# Patient Record
Sex: Female | Born: 1955 | Race: White | Hispanic: No | Marital: Married | Smoking: Former smoker
Health system: Southern US, Community
[De-identification: ages and names within clinical notes are randomized; demographics above are authoritative.]

## PROBLEM LIST (undated history)

## (undated) DIAGNOSIS — I8393 Asymptomatic varicose veins of bilateral lower extremities: Secondary | ICD-10-CM

## (undated) HISTORY — DX: Asymptomatic varicose veins of bilateral lower extremities: I83.93

## (undated) HISTORY — PX: OTHER SURGICAL HISTORY: SHX169

---

## 1999-04-10 ENCOUNTER — Ambulatory Visit (HOSPITAL_BASED_OUTPATIENT_CLINIC_OR_DEPARTMENT_OTHER): Admission: RE | Admit: 1999-04-10 | Discharge: 1999-04-10 | Payer: Self-pay

## 1999-08-16 ENCOUNTER — Other Ambulatory Visit: Admission: RE | Admit: 1999-08-16 | Discharge: 1999-08-16 | Payer: Self-pay | Admitting: Obstetrics and Gynecology

## 2001-01-23 ENCOUNTER — Other Ambulatory Visit: Admission: RE | Admit: 2001-01-23 | Discharge: 2001-01-23 | Payer: Self-pay | Admitting: Obstetrics and Gynecology

## 2002-02-17 ENCOUNTER — Other Ambulatory Visit: Admission: RE | Admit: 2002-02-17 | Discharge: 2002-02-17 | Payer: Self-pay | Admitting: Obstetrics and Gynecology

## 2003-09-21 ENCOUNTER — Other Ambulatory Visit: Admission: RE | Admit: 2003-09-21 | Discharge: 2003-09-21 | Payer: Self-pay | Admitting: Obstetrics and Gynecology

## 2005-01-30 ENCOUNTER — Other Ambulatory Visit: Admission: RE | Admit: 2005-01-30 | Discharge: 2005-01-30 | Payer: Self-pay | Admitting: Obstetrics and Gynecology

## 2008-01-16 ENCOUNTER — Emergency Department (HOSPITAL_COMMUNITY): Admission: EM | Admit: 2008-01-16 | Discharge: 2008-01-16 | Payer: Self-pay | Admitting: Emergency Medicine

## 2010-10-31 ENCOUNTER — Ambulatory Visit: Payer: Self-pay | Admitting: Vascular Surgery

## 2011-02-05 ENCOUNTER — Ambulatory Visit: Payer: Self-pay | Admitting: Vascular Surgery

## 2011-04-16 NOTE — Procedures (Signed)
LOWER EXTREMITY VENOUS REFLUX EXAM   INDICATION:  Varicose veins with pain.   EXAM:  Using color-flow imaging and pulse Doppler spectral analysis, the  right and left common femoral, superficial femoral, popliteal, posterior  tibial, greater and lesser saphenous veins are evaluated.  There is no  evidence suggesting deep venous insufficiency in the right and left  lower extremities.   The right saphenofemoral junction is competent. The left saphenofemoral  junction is not competent with reflux of >500 milliseconds.  The right  and left GSV's are not competent with Reflux of >558milliseconds with  the caliber as described below.   The right and left proximal short saphenous vein was not visualized.   GSV Diameter (used if found to be incompetent only)                                            Right    Left  Proximal Greater Saphenous Vein           0.56 cm  0.71 cm  Proximal-to-mid-thigh                     0.40 cm  0.54 cm  Mid thigh                                 0.45 cm  0.59 cm  Mid-distal thigh                          cm       cm  Distal thigh                              0.43 cm  0.56 cm  Knee                                      cm       cm    IMPRESSION:  1. Right and left greater saphenous vein is not competent with reflux      of >58milliseconds.  2. The right and left greater saphenous veins are tortuous.  3. The deep venous system is competent.  4. The right and left short saphenous veins were not visualized.          ___________________________________________  Larina Earthly, M.D.   OD/MEDQ  D:  10/31/2010  T:  10/31/2010  Job:  [id number]

## 2011-04-16 NOTE — Consult Note (Signed)
NEW PATIENT CONSULTATION   Joanna Floyd, Joanna Floyd  DOB:  04-02-1956                                       10/31/2010  CHART#:[id number]   The patient presents today for evaluation of lower extremity  difficulties.  Her main complaint is severe muscle cramps bilaterally.  This happens mostly at night and can awaken her from sleep, causes  severe discomfort, and she has to walk to relieve the discomfort.  She  has had discomfort despite all treatment possibilities.  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 Marcy Panning approximately  10 or 11 years ago.  She was not pleased with her results and did not  seek any treatment for recurrent varicosities over time.  She is hopeful  that the these veins may be causing the severe cramping that she is  having and is seeking further evaluation.  She does not have any history  of DVT.  She does have no major medical difficulties to include any  cardiac or pulmonary dysfunction.   SOCIAL HISTORY:  She is married with 2 children.  She is a IT trainer.  She quit smoking at in 1981.  Has rare social  alcohol consumption.   FAMILY HISTORY:  Significant for varicosities in her mother.   REVIEW OF SYSTEMS:  No weight loss or gain.  She weighs 190 pounds.  She  is 5 feet 2 inches tall.  She does have pain in her legs as described  with cramping.  She does have history of anxiety.  Her review of systems  is otherwise negative except for HPI.   PHYSICAL EXAM:  General:  Well developed, well nourished white female  appearing stated age in no acute stress.  Blood pressure is 168/92,  pulse 88, respirations 22.  HEENT is normal.  Abdomen:  Soft, nontender.  No masses noted.  Musculoskeletal shows no major deformity or cyanosis.  Neurologic:  No focal weakness or paresthesias.  Skin without ulcers or  rashes.  She does have 2+ dorsalis  pedis pulses bilaterally.  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   She underwent a formal venous duplex in our office and this revealed no  evidence of DVT.  She does have some mild reflux in her deep system.  She does not have visualization of her small saphenous vein bilaterally.  She has what appears to be tributary varicosities around her great  saphenous vein.  On imaging to her thigh, there is a great deal of  tortuosity of branches but I do not see any main trunk of her saphenous  vein and assume that she has had prior stripping of this.   I have discussed options with the patient.  I have explained that I do  not feel that her muscle spasms are related to venous pathology and that  if we corrected what we could that she would still have discomfort.  She  does have pain specifically over the tributary varicosities with  prolonged standing and I explained that this could be treated with both  sclerotherapy for her reticular veins and stab phlebectomy for the  tributary varicosities to improve symptoms specifically of the veins  themselves.  She has not worn compression garments or tried conservative  therapy.  We have fitted her with thigh-high compression garments today  and instructed her on the use of these.  She will continue the use of  this and we will see her again in 3 months to most determine if this is  successfully treating her pain.     Larina Earthly, M.D.  Electronically Signed   TFE/MEDQ  D:  10/31/2010  T:  11/01/2010  Job:  [id number]   cc:   Miguel Aschoff, M.D.  Daniel B. Yetta Barre, M.D.

## 2011-08-23 LAB — BASIC METABOLIC PANEL
CO2: 25
Calcium: 8.6
Chloride: 107
GFR calc Af Amer: >60
Potassium: 4.6
Sodium: 140

## 2011-08-23 LAB — URINALYSIS, ROUTINE W REFLEX MICROSCOPIC
Bilirubin Urine: NEGATIVE
Ketones, ur: NEGATIVE
Protein, ur: NEGATIVE
Urobilinogen, UA: 0.2

## 2011-08-23 LAB — CBC
Hemoglobin: 12.1
MCHC: 33.9
MCV: 78.8
RBC: 4.53

## 2011-08-23 LAB — DIFFERENTIAL
Basophils Relative: 0
Monocytes Absolute: 0.3
Monocytes Relative: 5
Neutro Abs: 5.6

## 2012-05-07 ENCOUNTER — Ambulatory Visit (INDEPENDENT_AMBULATORY_CARE_PROVIDER_SITE_OTHER): Payer: BC Managed Care – PPO | Admitting: Family Medicine

## 2012-05-07 ENCOUNTER — Ambulatory Visit: Payer: BC Managed Care – PPO

## 2012-05-07 VITALS — BP 160/100 | HR 63 | Temp 98.4°F | Resp 16 | Ht 61.58 in | Wt 199.0 lb

## 2012-05-07 DIAGNOSIS — R030 Elevated blood-pressure reading, without diagnosis of hypertension: Secondary | ICD-10-CM

## 2012-05-07 DIAGNOSIS — M25539 Pain in unspecified wrist: Secondary | ICD-10-CM

## 2012-05-07 DIAGNOSIS — M25532 Pain in left wrist: Secondary | ICD-10-CM

## 2012-05-07 DIAGNOSIS — M79642 Pain in left hand: Secondary | ICD-10-CM

## 2012-05-07 MED ORDER — ACETAMINOPHEN-CODEINE #3 300-30 MG PO TABS: ORAL_TABLET | ORAL | Status: DC

## 2012-05-07 MED ORDER — IBUPROFEN 800 MG PO TABS: 800.0000 mg | ORAL_TABLET | Freq: Three times a day (TID) | ORAL | Status: AC | PRN

## 2012-05-07 NOTE — Progress Notes (Addendum)
  Subjective:    Patient ID: Joanna Floyd, female    DOB: 04/25/1956, 56 y.o.   MRN: [id number]  HPI  Patient presents after slipping on deck on outstretched (L) hand Now with swelling, pain and reduced ROM of (L) wrist/hand  PMH/IBS         Anemia         Situational anxiety          H/O HTN  Review of Systems     Objective:   Physical Exam  Constitutional: She appears well-developed.  Cardiovascular: Normal rate, regular rhythm and normal heart sounds.   Pulmonary/Chest: Effort normal and breath sounds normal.  Musculoskeletal:       Left wrist: She exhibits decreased range of motion, bony tenderness, swelling and deformity.     [facility] reading (PRIMARY) by  Dr. Hal Hope  Impacted fracture of the distal radius of the (L) arm        Assessment & Plan:   1. impacted fracture of the left distal radius   DG Hand Complete Left, Ambulatory referral to Family Practice, acetaminophen-codeine (TYLENOL #3) 300-30 MG per tablet, ibuprofen (ADVIL,MOTRIN) 800 MG tablet  2. Wrist pain, left  DG Wrist Complete Left  3. Elevated BP  Patient to follow up with Dr. Tenny Craw regarding BP controll     See PA note Fracture splinted Patient to follow up with Dr. Althea Charon next week

## 2012-07-29 ENCOUNTER — Encounter: Payer: Self-pay | Admitting: Physician Assistant

## 2012-07-29 DIAGNOSIS — S52509A Unspecified fracture of the lower end of unspecified radius, initial encounter for closed fracture: Secondary | ICD-10-CM

## 2012-10-13 IMAGING — CR DG HAND COMPLETE 3+V*L*
3 series · 3 of 3 positions shown · non-contrast
Comparison: Left wrist series from same day.

CLINICAL DATA: 56-year-old female with pain.

LEFT HAND - COMPLETE 3+ VIEW

[PA]
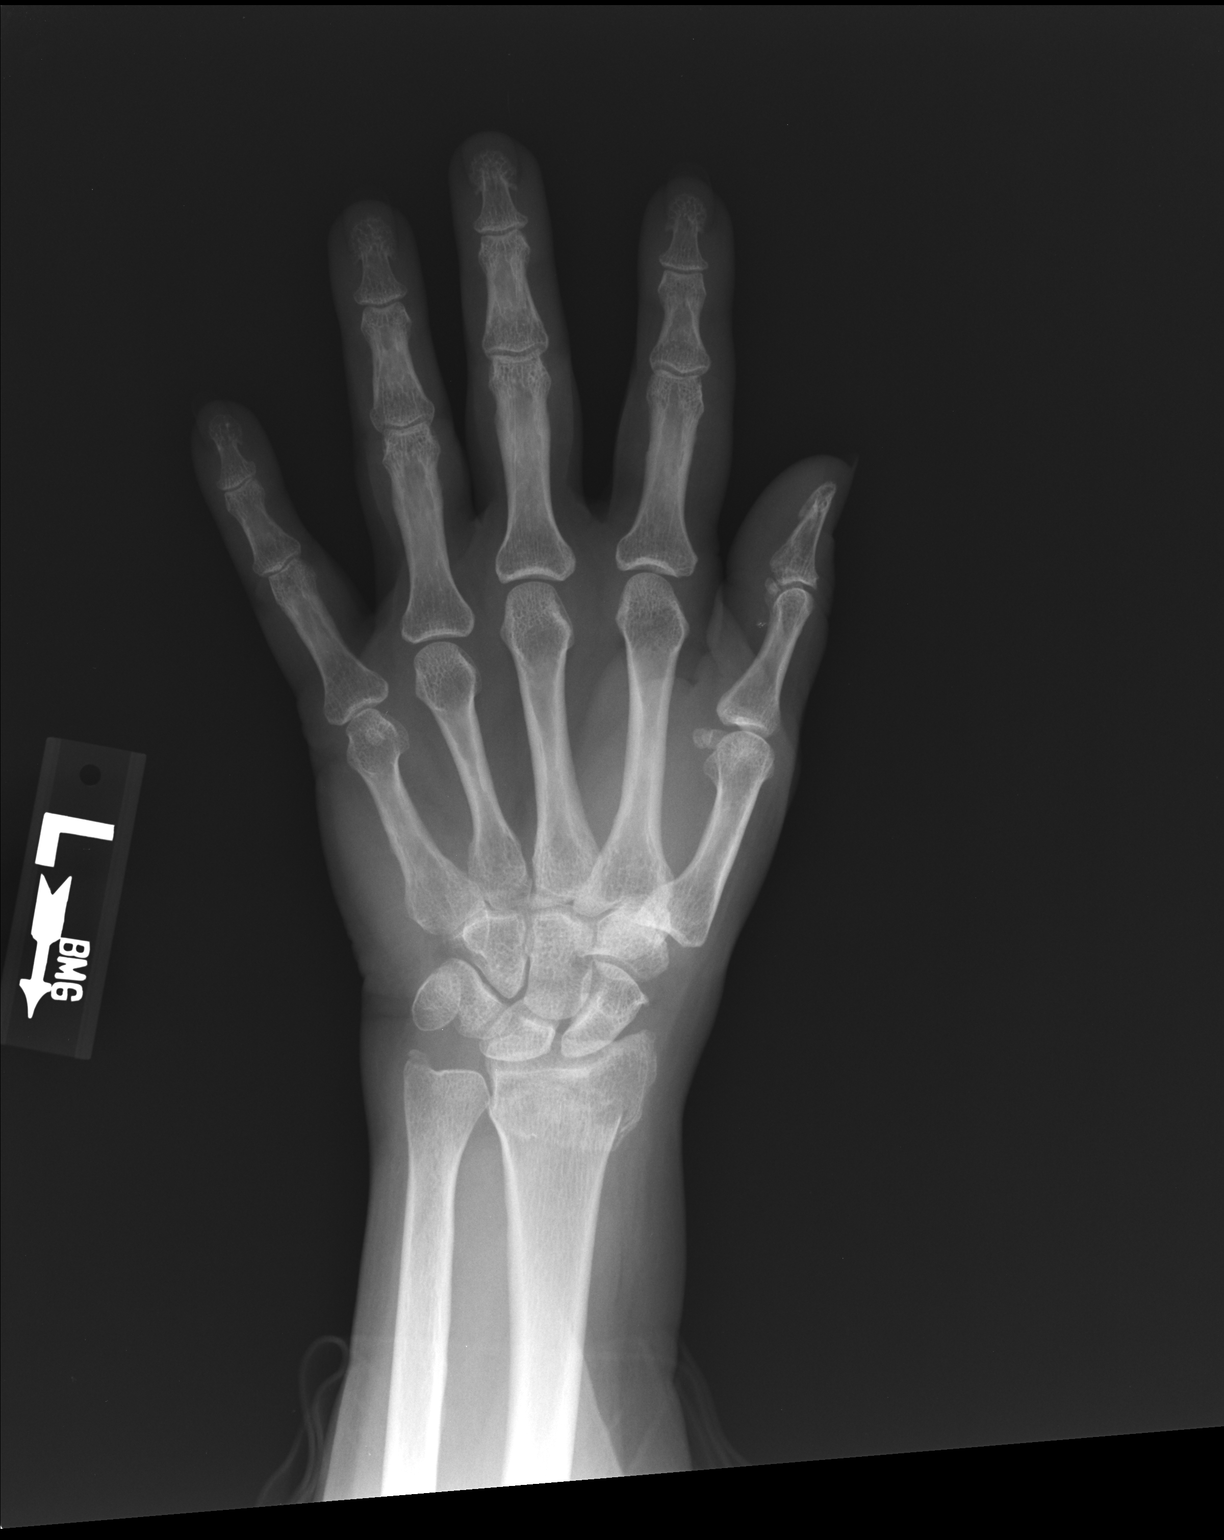

[pa obl]
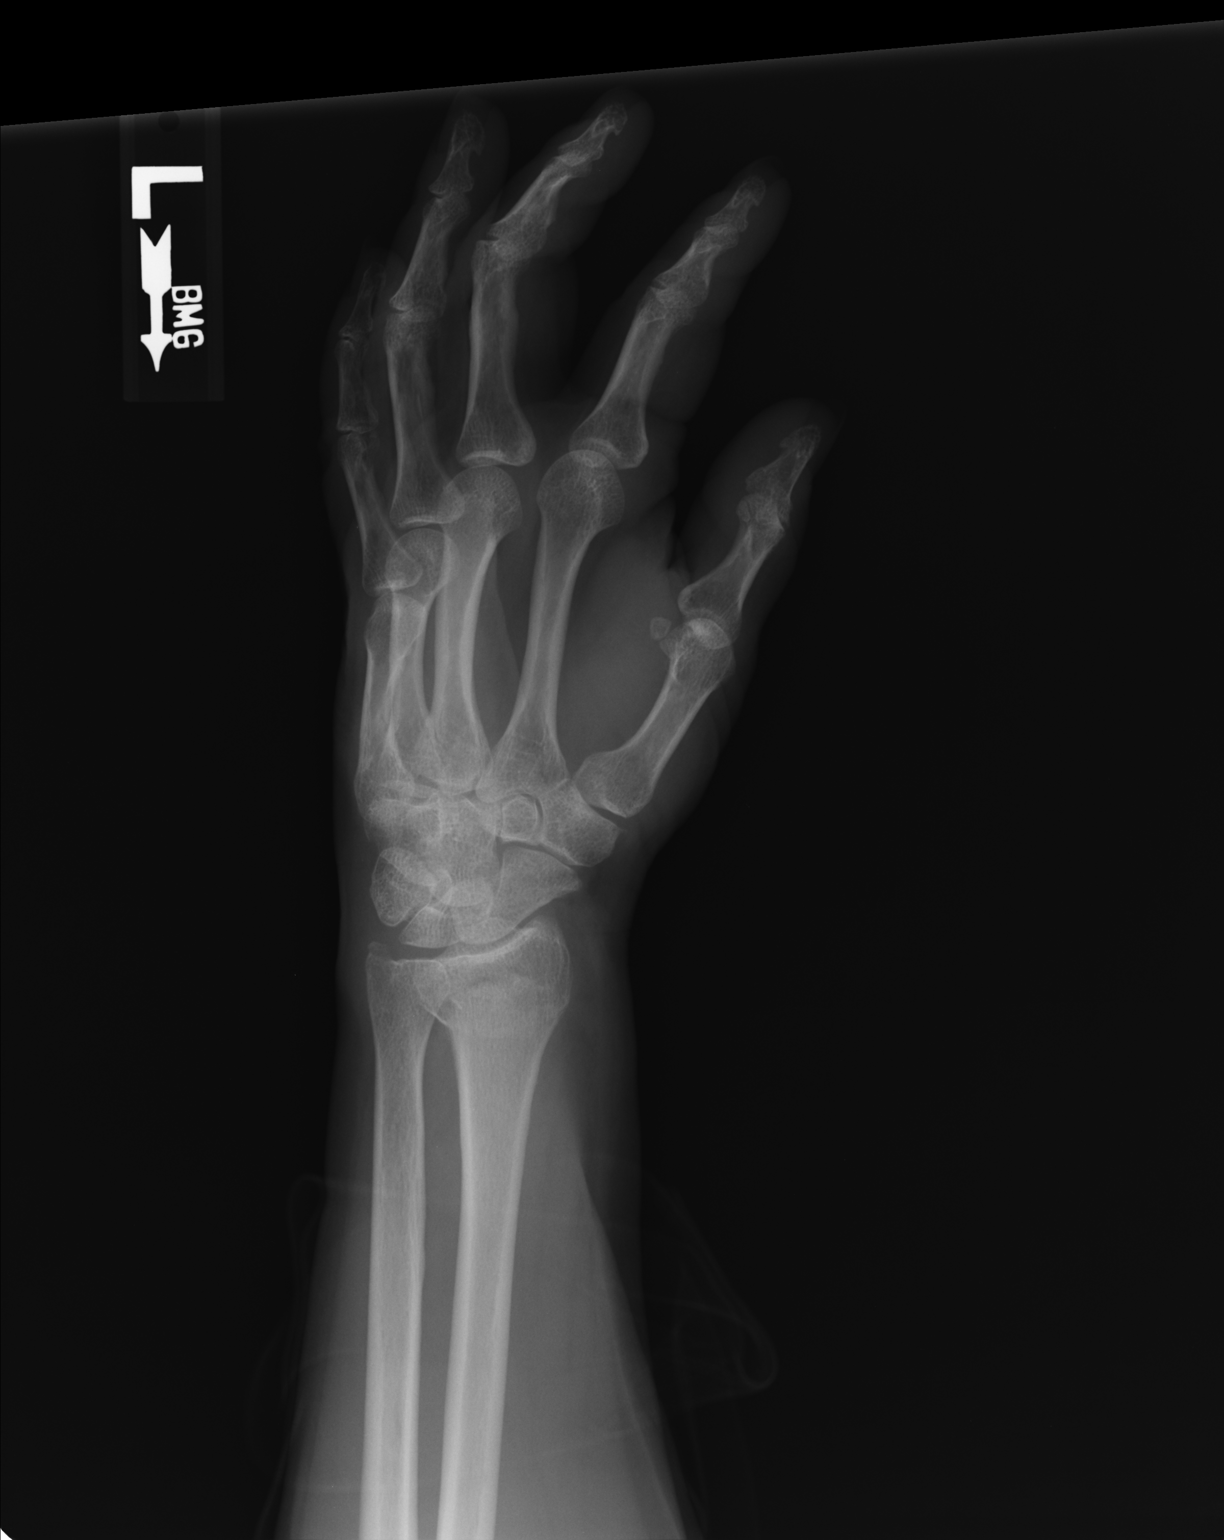

[lateral]
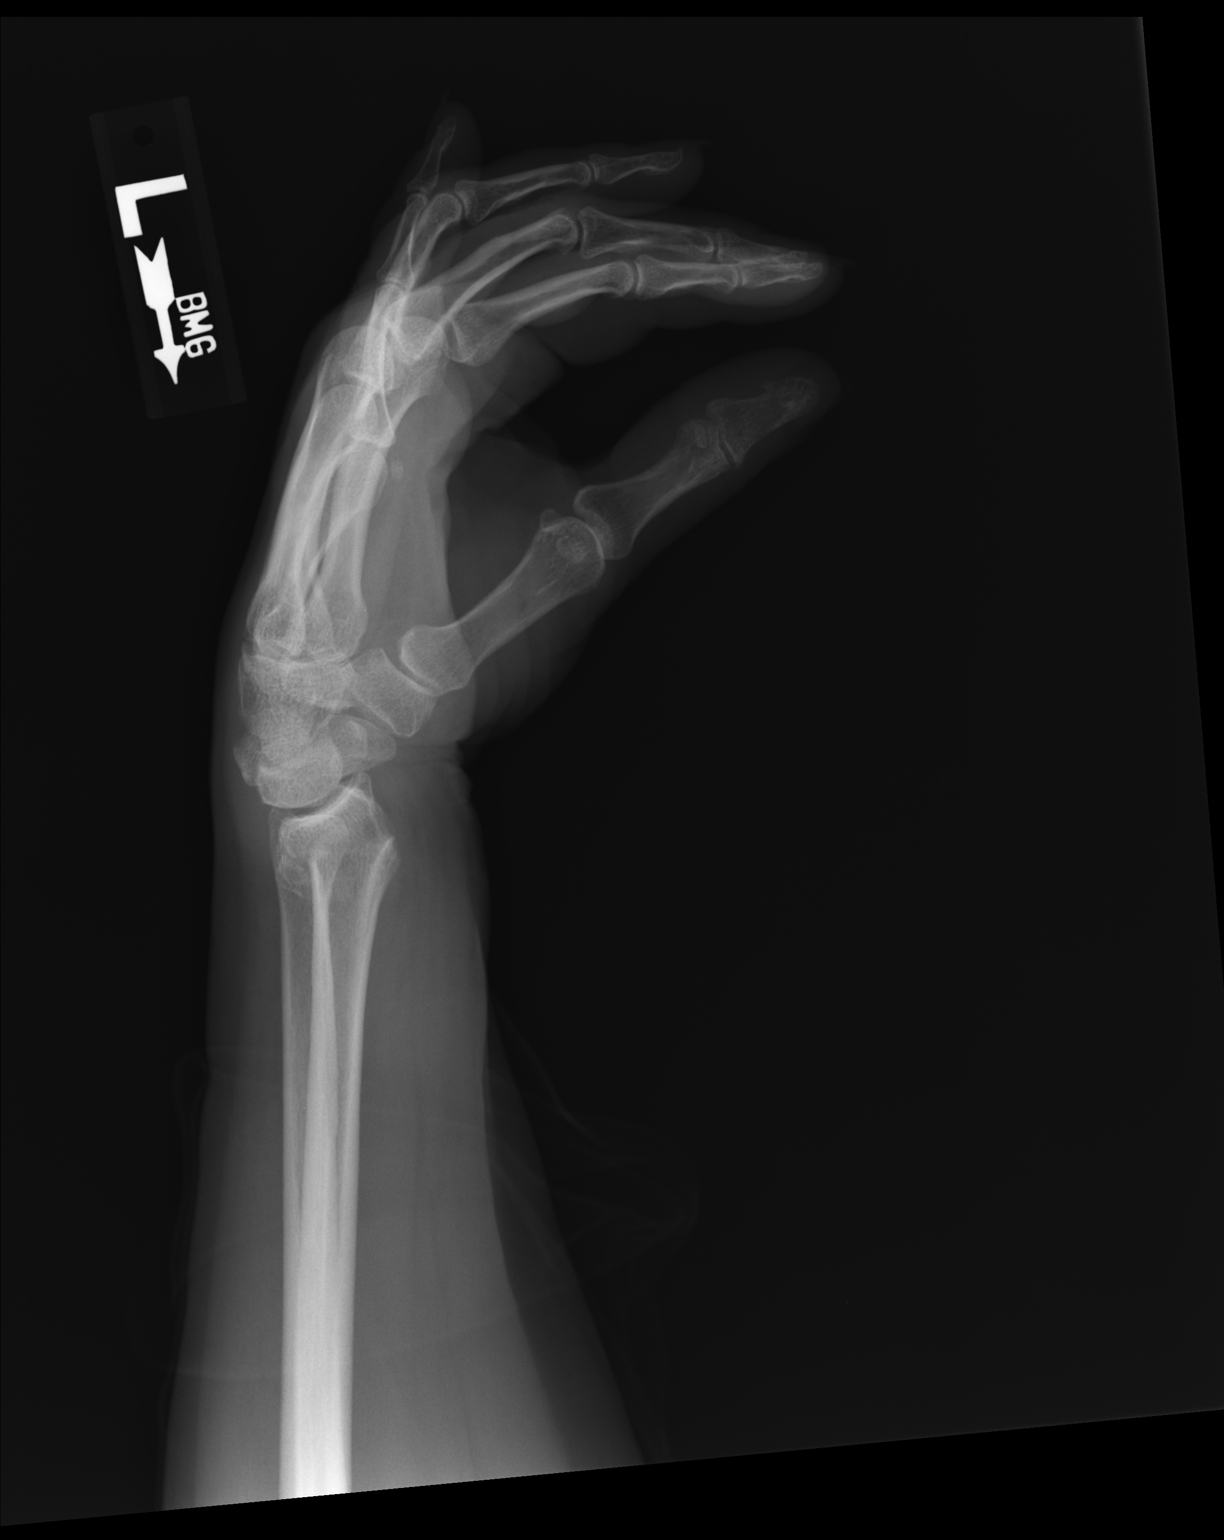

[3 of 3 positions shown; findings below may reference images not displayed]

FINDINGS: Distal left radius and ulna fractures as detailed on the
comparison, please see that report.  Carpal bones appear intact.
Metacarpals and phalanges appear intact.
IMPRESSION: 1.  Distal left radius and ulna fractures, see wrist series.
2.  No other acute fracture identified in the left hand.

Clinically significant discrepancy from primary report, if
provided: None

## 2015-01-21 ENCOUNTER — Ambulatory Visit: Payer: BC Managed Care – PPO | Admitting: Podiatry

## 2015-01-27 ENCOUNTER — Ambulatory Visit (INDEPENDENT_AMBULATORY_CARE_PROVIDER_SITE_OTHER): Payer: BC Managed Care – PPO | Admitting: Podiatry

## 2015-01-27 ENCOUNTER — Ambulatory Visit (INDEPENDENT_AMBULATORY_CARE_PROVIDER_SITE_OTHER): Payer: BC Managed Care – PPO

## 2015-01-27 ENCOUNTER — Encounter: Payer: Self-pay | Admitting: Podiatry

## 2015-01-27 VITALS — BP 176/93 | HR 70 | Resp 16

## 2015-01-27 DIAGNOSIS — M79671 Pain in right foot: Secondary | ICD-10-CM

## 2015-01-27 DIAGNOSIS — R252 Cramp and spasm: Secondary | ICD-10-CM

## 2015-01-27 DIAGNOSIS — S99921S Unspecified injury of right foot, sequela: Secondary | ICD-10-CM

## 2015-01-27 DIAGNOSIS — S8991XS Unspecified injury of right lower leg, sequela: Secondary | ICD-10-CM

## 2015-01-27 MED ORDER — MELOXICAM 7.5 MG PO TABS: 7.5000 mg | ORAL_TABLET | Freq: Every day | ORAL | Status: DC

## 2015-01-27 NOTE — Progress Notes (Signed)
   Subjective:    Patient ID: Joanna Floyd, female    DOB: 08-10-1956, 59 y.o.   MRN: [id number]  HPI  59 year old female presents the office they with multiple complaints. She states that she has pain on the ball of her feet particularly on the right side which is been ongoing for several months. She denies any history of injury or trauma. She denies any overlying swelling or redness. She states that she has pain after standing for prolonged periods of time. She also states that she has a callus on her right foot which is painful particularly with pressure. She denies any redness or drainage along the callus. She also states that she has cramps in both of her feet particularly at night. Denies any calf pain or cramping. States that the cramping is mostly in the toes. No other complaints at this time.   Review of Systems  All other systems reviewed and are negative.      Objective:   Physical Exam AAO x3, NAD DP/PT pulses palpable bilaterally, CRT less than 3 seconds; varicose veins bilaterally. Protective sensation intact with Dorann OuSimms Weinstein monofilament, vibratory sensation intact, Achilles tendon reflex intact There is mild diffuse tenderness along plantar metatarsal heads 1-5 on the right. There is no pinpoint bony tenderness or pain with vibratory sensation along the metatarsals of the digits. There is no pain with MTPJ range of motion. There is a hyperkeratotic lesion submetatarsal 2 on the right foot. Upon debridement of the lesion there is no underlying ulceration, drainage or other clinical signs of infection. There is mild deviation of the right second digit with some mild amount of edema along the plantar aspect the digit just distal to the metatarsal head. There is no overlying erythema or increase in warmth. There is mild increase in Lachman test on the right 2nd MTPJ compared to the left.  There is mild tenderness upon palpation of this area. No other areas of edema, erythema,  increase in warmth to bilateral lower extremities. MMT 5/5, ROM WNL No open lesions or pre-ulcerative lesions are identified. No pain with calf compression, swelling, warmth, erythema.     Assessment & Plan:  59 year old female right second metatarsal 2 hyperkeratotic lesion, likely plantar plate injury second digit, night cramps. -X-rays were obtained and reviewed with the patient. -Treatment options were discussed the patient include alternatives, risks, complications. -Lesion was Debrided without complications/bleeding. -Discussed likely etiology of the patient's symptoms. There is mild amount of edema along the plantar plate of the second digit mild deviation digit. 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 Dispensed pads to help offload the symptomatic area and discussed taping. Prescribed meloxicam. Discussed side effects the medication digit to stop immediately should any occur and call the office. -In regards to cramping recommended patient follow-up with her primary care physician to rule out electrolyte abnormalities. Also could be treated by mechanical in nature. -Follow-up in 4 weeks or sooner if any problems are to arise. In the meantime, encouraged call the office and requests, concerns, change in symptoms.

## 2015-03-03 ENCOUNTER — Other Ambulatory Visit: Payer: Self-pay | Admitting: Podiatry

## 2015-03-03 ENCOUNTER — Ambulatory Visit: Payer: BC Managed Care – PPO | Admitting: Podiatry

## 2015-03-10 ENCOUNTER — Ambulatory Visit: Payer: BC Managed Care – PPO | Admitting: Podiatry

## 2015-03-24 ENCOUNTER — Ambulatory Visit: Payer: BC Managed Care – PPO | Admitting: Podiatry

## 2015-04-28 ENCOUNTER — Ambulatory Visit: Payer: BC Managed Care – PPO | Admitting: Podiatry

## 2016-10-08 ENCOUNTER — Encounter: Payer: BC Managed Care – PPO | Attending: Family Medicine | Admitting: *Deleted

## 2016-10-08 DIAGNOSIS — Z713 Dietary counseling and surveillance: Secondary | ICD-10-CM | POA: Diagnosis not present

## 2016-10-08 DIAGNOSIS — E119 Type 2 diabetes mellitus without complications: Secondary | ICD-10-CM | POA: Diagnosis present

## 2016-10-10 NOTE — Progress Notes (Signed)
Patient was seen on 10/08/2016 for the first of a series of three diabetes self-management courses at the [facility]. She is the only person in class so we completed all topics in this session.  Patient Education Plan per assessed needs and concerns is to attend four course education program for Diabetes Self Management Education.  The following learning objectives were met by the patient during this class:  Describe diabetes  State some common risk factors for diabetes  Defines the role of glucose and insulin  Identifies type of diabetes and pathophysiology  Describe the relationship between diabetes and cardiovascular risk  State the members of the Healthcare Team  States the rationale for glucose monitoring  State when to test glucose  State their individual Target Range  State the importance of logging glucose readings  Describe how to interpret glucose readings  Identifies A1C target  Explain the correlation between A1c and eAG values  State symptoms and treatment of high blood glucose  State symptoms and treatment of low blood glucose  Explain proper technique for glucose testing  Identifies proper sharps disposal  Handouts given during class include:  Living Well with Diabetes book  Carb Counting and Meal Planning book  Meal Plan Card  Carbohydrate guide  Meal planning worksheet  Low Sodium Flavoring Tips  The diabetes portion plate  Y0V to eAG Conversion Chart  Diabetes Medications  Diabetes Recommended Care Schedule  Support Group  Diabetes Success Plan  Core Class Satisfaction Survey  Patient was seen on 10/08/2016 for the second of a series of three diabetes self-management courses at the [facility]. The following learning objectives were met by the patient during this class:   Describe the role of different macronutrients on glucose  Explain how carbohydrates affect blood  glucose  State what foods contain the most carbohydrates  Demonstrate carbohydrate counting  Demonstrate how to read Nutrition Facts food label  Describe effects of various fats on heart health  Describe the importance of good nutrition for health and healthy eating strategies  Describe techniques for managing your shopping, cooking and meal planning  List strategies to follow meal plan when dining out  Describe the effects of alcohol on glucose and how to use it safely  Goals:  Follow Diabetes Meal Plan as instructed  Eat 3 meals and 2 snacks, every 3-5 hrs  Limit carbohydrate intake to 45 grams carbohydrate/meal Limit carbohydrate intake to 15 grams carbohydrate/snack Add lean protein foods to meals/snacks  Monitor glucose levels as instructed by your doctor    Patient was seen on 10/08/2016 for the third of a series of three diabetes self-management courses at the [facility].   Catalina Gravel the amount of activity recommended for healthy living . Describe activities suitable for individual needs . Identify ways to regularly incorporate activity into daily life . Identify barriers to activity and ways to over come these barriers  Identify diabetes medications being personally used and their primary action for lowering glucose and possible side effects . Describe role of stress on blood glucose and develop strategies to address psychosocial issues . Identify diabetes complications and ways to prevent them  Explain how to manage diabetes during illness . Evaluate success in meeting personal goal . Establish 2-3 goals that they will plan to diligently work on until they return for the  43-monthfollow-up visit  Goals:   I will count my carb choices at most meals and snacks  I will consider getting a meter to test my glucose several days a week as needed  Plan:  Attend Support Group as desired

## 2016-10-15 ENCOUNTER — Ambulatory Visit: Payer: Self-pay

## 2016-10-22 ENCOUNTER — Ambulatory Visit: Payer: Self-pay

## 2017-08-11 ENCOUNTER — Other Ambulatory Visit: Payer: Self-pay

## 2017-08-11 DIAGNOSIS — I83813 Varicose veins of bilateral lower extremities with pain: Secondary | ICD-10-CM

## 2017-08-21 DIAGNOSIS — R140 Abdominal distension (gaseous): Secondary | ICD-10-CM | POA: Insufficient documentation

## 2017-08-21 DIAGNOSIS — R1084 Generalized abdominal pain: Secondary | ICD-10-CM | POA: Insufficient documentation

## 2017-08-21 DIAGNOSIS — K580 Irritable bowel syndrome with diarrhea: Secondary | ICD-10-CM | POA: Insufficient documentation

## 2017-08-22 DIAGNOSIS — R152 Fecal urgency: Secondary | ICD-10-CM | POA: Insufficient documentation

## 2017-09-30 ENCOUNTER — Ambulatory Visit (INDEPENDENT_AMBULATORY_CARE_PROVIDER_SITE_OTHER): Payer: BC Managed Care – PPO | Admitting: Vascular Surgery

## 2017-09-30 ENCOUNTER — Encounter: Payer: Self-pay | Admitting: Vascular Surgery

## 2017-09-30 ENCOUNTER — Ambulatory Visit (HOSPITAL_COMMUNITY)
Admission: RE | Admit: 2017-09-30 | Discharge: 2017-09-30 | Disposition: A | Discharge status: Home / Self Care | Payer: BC Managed Care – PPO | Source: Ambulatory Visit | Attending: Vascular Surgery | Admitting: Vascular Surgery

## 2017-09-30 VITALS — BP 138/68 | HR 59 | Temp 98.5°F | Resp 20 | Ht 62.5 in | Wt 197.8 lb

## 2017-09-30 DIAGNOSIS — I83813 Varicose veins of bilateral lower extremities with pain: Secondary | ICD-10-CM | POA: Insufficient documentation

## 2017-09-30 NOTE — Progress Notes (Signed)
[facility] [place]  Patient name: Joanna Floyd MRN: [id number] DOB: Feb 15, 1956 Sex: female  REASON FOR CONSULT: Bilateral painful extensive varicose veins  HPI: Joanna Floyd is a 61 y.o. female, who is in for evaluation of bilateral lower she has a remote past history of bilateral vein stripping.  It was done here in [place] but not with our practice.  She did have a reasonable result for some period of time but always felt there were some areas that continue to have varicosities.  This has been continually progressive over this period of time and now she has large painful varicosities that extend across her anterior left thigh and lateral calf down towards her ankle.  On the right these are more diffusely enlarged over her thigh and calf.  She has had no DVT and has had no bleeding from these.  She does have swelling associated with her varicosities.  She has worn compression garments in the past and has had minimal improvement related to this.  No past medical history on file.  No family history on file.  SOCIAL HISTORY: Social History   Social History  . Marital status: Married    Spouse name: N/A  . Number of children: N/A  . Years of education: N/A   Occupational History  . Not on file.   Social History Main Topics  . Smoking status: Former Smoker    Quit date: 05/07/1980  . Smokeless tobacco: Never Used  . Alcohol use Not on file  . Drug use: Unknown  . Sexual activity: Not on file   Other Topics Concern  . Not on file   Social History Narrative  . No narrative on file    No Known Allergies  Current Outpatient Prescriptions  Medication Sig Dispense Refill  . clonazePAM (KLONOPIN) 0.5 MG tablet Take 0.5 mg by mouth 2 (two) times daily as needed for anxiety.    . Colloidal Oatmeal (ECZEMA MOISTURIZING) 1 % LOTN Apply topically.    Marland Kitchen Hyoscyamine Sulfate 0.375 MG TBCR Take by mouth 2 (two) times daily.      No current facility-administered medications for this visit.     REVIEW OF SYSTEMS:  [X]  denotes positive finding, [ ]  denotes negative finding Cardiac  Comments:  Chest pain or chest pressure: x   Shortness of breath upon exertion:    Short of breath when lying flat:    Irregular heart rhythm:        Vascular    Pain in calf, thigh, or hip brought on by ambulation:    Pain in feet at night that wakes you up from your sleep:  x   Blood clot in your veins:    Leg swelling:  x       Pulmonary    Oxygen at home:    Productive cough:     Wheezing:         Neurologic    Sudden weakness in arms or legs:     Sudden numbness in arms or legs:  x   Sudden onset of difficulty speaking or slurred speech:    Temporary loss of vision in one eye:     Problems with dizziness:         Gastrointestinal    Blood in stool:     Vomited blood:         Genitourinary    Burning when urinating:     Blood in urine:  Psychiatric    Major depression:         Hematologic    Bleeding problems:    Problems with blood clotting too easily:        Skin    Rashes or ulcers: x       Constitutional    Fever or chills:      PHYSICAL EXAM: Vitals:   09/30/17 1411  BP: 138/68  Pulse: (!) 59  Resp: 20  Temp: 98.5 F (36.9 C)  TempSrc: Oral  SpO2: 97%  Weight: 197 lb 12.8 oz (89.7 kg)  Height: 5' 2.5" (1.588 m)    GENERAL: The patient is a well-nourished female, in no acute distress. The vital signs are documented above. CARDIOVASCULAR: 2+ radial pulses and 2+ dorsalis pedis pulses bilaterally Large varicosities extending over both lower extremities PULMONARY: There is good air exchange  ABDOMEN: Soft and non-tender  MUSCULOSKELETAL: There are no major deformities or cyanosis. NEUROLOGIC: No focal weakness or paresthesias are detected. SKIN: There are no ulcers or rashes noted. PSYCHIATRIC: The patient has a normal affect.  DATA:  Duplex ultrasound revealed reflux in her  great saphenous vein with enlargement at the saphenofemoral junction and proximal thighs bilaterally.  Her more distal saphenous vein had been stripped.  MEDICAL ISSUES: Painful varicose veins which have become more progressive over the past several years.  I did image her veins with solid side ultrasound and these were directly off the proximal thigh saphenous vein bilaterally.  She has worn compression garments in the past but not recently.  We have fitted her today for thigh-high 20-30 mm compression garments and explained the importance of the use of these.  Also explained the importance of elevation and ibuprofen for discomfort.  She would be an excellent candidate for staged bilateral saphenous vein ablation and stab phlebectomy of her painful tributary varicosities.  We will see her again in 3 months to determine her response to conservative treatment.   Joanna Floyd F. Sydne Krahl, MD FACS [facility] Tel [phone] Pager [phone]

## 2017-12-30 ENCOUNTER — Ambulatory Visit: Payer: BC Managed Care – PPO | Admitting: Vascular Surgery

## 2018-04-07 ENCOUNTER — Encounter: Payer: Self-pay | Admitting: Vascular Surgery

## 2018-06-18 ENCOUNTER — Telehealth: Payer: Self-pay | Admitting: Vascular Surgery

## 2018-06-18 NOTE — Telephone Encounter (Signed)
sch app lvm 09/17/18 1245pm f/u MD

## 2018-06-24 ENCOUNTER — Encounter: Payer: Self-pay | Admitting: Vascular Surgery

## 2018-06-24 ENCOUNTER — Ambulatory Visit: Payer: BC Managed Care – PPO | Admitting: Vascular Surgery

## 2018-06-24 VITALS — BP 149/77 | HR 79 | Temp 98.6°F | Resp 16 | Ht 62.5 in | Wt 199.0 lb

## 2018-06-24 DIAGNOSIS — I83813 Varicose veins of bilateral lower extremities with pain: Secondary | ICD-10-CM | POA: Diagnosis not present

## 2018-06-24 NOTE — Progress Notes (Addendum)
Patient name: Joanna Floyd MRN: [id number] DOB: 01-18-1956 Sex: female  REASON FOR VISIT:   Follow-up of venous insufficiency.  HPI:   Joanna Floyd is a pleasant 62 y.o. female who was last seen by Dr. 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  This patient had undergone previous surgery I believe by Dr. Orson SlickBowman and had veins from both legs stripped.  At the time of her last visit, she had reflux in the anterior accessory vein on the right and also reflux in the great saphenous vein on the left in the proximal thigh.  Dr. Tawanna Coolerodd Early recommended thigh-high compression stockings with a gradient of 20 to 30 mmHg and she returns for 292-month follow-up visit.  He felt that if the stockings did not help, she would be a good candidate for staged bilateral endovenous laser ablations and stab phlebectomies.  Since her last visit, she continues to have pain in both legs which she describes as aching and heaviness.  She also has cramps in her legs.  These occur where she has enlarged varicose veins.  Her symptoms are aggravated by standing.  She states elevation does not help significantly.  She does not like to take ibuprofen.  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  History reviewed. No pertinent past medical history.  History reviewed. No pertinent family history.  SOCIAL HISTORY: Social History   Tobacco Use  . Smoking status: Former Smoker    Last attempt to quit: 05/07/1980    Years since quitting: 38.1  . Smokeless tobacco: Never Used  Substance Use Topics  . Alcohol use: Not on file    No Known Allergies  Current Outpatient Medications  Medication Sig Dispense Refill  . clonazePAM (KLONOPIN) 0.5 MG tablet Take 0.5 mg by mouth 2 (two) times daily as needed for anxiety.    . Colloidal Oatmeal (ECZEMA MOISTURIZING) 1 % LOTN Apply topically.    Marland Kitchen. Hyoscyamine Sulfate 0.375 MG TBCR Take by mouth 2 (two) times daily.     No current  facility-administered medications for this visit.     REVIEW OF SYSTEMS:  [X]  denotes positive finding, [ ]  denotes negative finding Cardiac  Comments:  Chest pain or chest pressure:    Shortness of breath upon exertion:    Short of breath when lying flat:    Irregular heart rhythm:        Vascular    Pain in calf, thigh, or hip brought on by ambulation:    Pain in feet at night that wakes you up from your sleep:     Blood clot in your veins:    Leg swelling:  x       Pulmonary    Oxygen at home:    Productive cough:     Wheezing:         Neurologic    Sudden weakness in arms or legs:     Sudden numbness in arms or legs:     Sudden onset of difficulty speaking or slurred speech:    Temporary loss of vision in one eye:     Problems with dizziness:         Gastrointestinal    Blood in stool:     Vomited blood:         Genitourinary    Burning when urinating:     Blood in urine:        Psychiatric  Major depression:         Hematologic    Bleeding problems:    Problems with blood clotting too easily:        Skin    Rashes or ulcers:        Constitutional    Fever or chills:     PHYSICAL EXAM:   Vitals:   06/24/18 1519  BP: (!) 149/77  Pulse: 79  Resp: 16  Temp: 98.6 F (37 C)  SpO2: 99%  Weight: 199 lb (90.3 kg)  Height: 5' 2.5" (1.588 m)    GENERAL: The patient is a well-nourished female, in no acute distress. The vital signs are documented above. CARDIAC: There is a regular rate and rhythm.  VASCULAR:  ARTERIAL: I do not detect carotid bruits.  She has palpable pedal pulses. VENOUS: On the left side which is the more symptomatic side, she has large varicose veins along the distal medial aspect of her left thigh which extend above her knee to the lateral left leg and also onto her anterior left leg.  She has some spider veins around the ankles and also the posterior left leg.  She has mild hyperpigmentation.  She has mild swelling. I looked with  the SonoSite on the left and she does have significant reflux in the proximal great saphenous vein in the thigh and the vein is significantly dilated.  I think we could access the vein in the proximal thigh. On the right side, she has some varicose veins in the medial right thigh and also the anterior right leg.  She has some spider veins in the posterior thigh and posterior calf.  She has mild left leg swelling with mild hyperpigmentation.  I did look at the accessory saphenous vein on the right with the SonoSite and this too has significant incompetence and appears that we could access this in the proximal thigh. PULMONARY: There is good air exchange bilaterally without wheezing or rales. ABDOMEN: Soft and non-tender with normal pitched bowel sounds.  MUSCULOSKELETAL: There are no major deformities or cyanosis. NEUROLOGIC: No focal weakness or paresthesias are detected. SKIN: There are no ulcers or rashes noted. PSYCHIATRIC: The patient has a normal affect.  DATA:    VENOUS DUPLEX: I reviewed her previous venous duplex scan which showed reflux in the accessory right great saphenous vein and also the proximal left great saphenous vein in the proximal thigh.  The remainder of the saphenous vein system on both sides had been stripped.  There is no evidence of DVT or superficial thrombophlebitis on either side.  MEDICAL ISSUES:   CHRONIC VENOUS INSUFFICIENCY: This patient has significant symptoms from her varicose veins bilaterally.  She has CEA clinical class 4a venous disease.  She is had previous procedures on both legs which involved surgical stripping of the great saphenous veins.  However, on the right side she has an incompetent accessory saphenous vein in the proximal thigh, and on the left side she has a incompetent great saphenous vein in the proximal thigh.  These veins are feeding large varicose veins bilaterally.  She has significant symptoms from her varicose veins and has failed  conservative treatment including leg elevation and thigh-high compression stockings with a gradient of 20 to 30 mmHg.  She does not like to take ibuprofen.  On the left side, which is the more symptomatic side, I think she is a candidate for endovenous laser ablation of the incompetent saphenous vein in the proximal thigh with greater than 20 stab  phlebectomies.  I think she would also be a candidate for sclerotherapy (1 syringe).  She could subsequently have a staged endovenous laser ablation of the accessory saphenous on the right with stab phlebectomies (10-20).  I have discussed the indications for endovenous laser ablation of the right GSV, that is to lower the pressure in the veins and potentially help relieve the symptoms from venous hypertension. I have also discussed alternative options including conservative treatment with leg elevation, compression therapy, and other lifestyle changes. I have discussed the potential complications of the procedure, including, but not limited to: bleeding, bruising, leg swelling, nerve injury, skin burns, significant pain from phlebitis, deep venous thrombosis, or failure of the vein to close. All of the patient's questions were encouraged and answered.  Of note, she was somewhat disappointed after her previous vein stripping procedures and I did not want her to get the impression that she would have no further issues with varicose veins.  I explained that this typically is a chronic problem and that other veins can appear.  Given the extent of her varicosities I do not think that we can get every single varicose vein during this 1 procedure.  She is agreeable to proceed and understands this.   Waverly Ferrari [facility] [phone]

## 2018-06-25 ENCOUNTER — Encounter (INDEPENDENT_AMBULATORY_CARE_PROVIDER_SITE_OTHER): Payer: BC Managed Care – PPO | Admitting: Vascular Surgery

## 2018-06-25 DIAGNOSIS — I868 Varicose veins of other specified sites: Secondary | ICD-10-CM

## 2018-06-29 ENCOUNTER — Encounter (HOSPITAL_COMMUNITY): Payer: BC Managed Care – PPO

## 2018-06-29 ENCOUNTER — Other Ambulatory Visit: Payer: Self-pay | Admitting: *Deleted

## 2018-06-29 ENCOUNTER — Ambulatory Visit: Payer: BC Managed Care – PPO | Admitting: Vascular Surgery

## 2018-06-29 DIAGNOSIS — I83813 Varicose veins of bilateral lower extremities with pain: Secondary | ICD-10-CM

## 2018-08-14 ENCOUNTER — Encounter: Payer: BC Managed Care – PPO | Admitting: Vascular Surgery

## 2018-08-14 DIAGNOSIS — I868 Varicose veins of other specified sites: Secondary | ICD-10-CM

## 2018-09-03 ENCOUNTER — Encounter: Payer: Self-pay | Admitting: Vascular Surgery

## 2018-09-03 ENCOUNTER — Ambulatory Visit: Payer: BC Managed Care – PPO | Admitting: Vascular Surgery

## 2018-09-03 VITALS — BP 179/86 | HR 83 | Temp 98.0°F | Resp 18 | Ht 62.5 in | Wt 193.0 lb

## 2018-09-03 DIAGNOSIS — I83813 Varicose veins of bilateral lower extremities with pain: Secondary | ICD-10-CM

## 2018-09-03 DIAGNOSIS — I868 Varicose veins of other specified sites: Secondary | ICD-10-CM

## 2018-09-03 NOTE — Progress Notes (Signed)
     Laser Ablation Procedure    Date: 09/03/2018   Joanna Floyd DOB:31-Jul-1956  Consent signed: Yes    Surgeon:  Dr. Tawanna Cooler Early  Procedure: Laser Ablation: left Greater Saphenous Vein  BP (!) 179/86   Pulse 83   Temp 98 F (36.7 C)   Resp 18   Ht 5' 2.5" (1.588 m)   Wt 193 lb (87.5 kg)   SpO2 100%   BMI 34.74 kg/m   Tumescent Anesthesia: 350 cc 0.9% NaCl with 50 cc Lidocaine HCL with 1% Epi and 15 cc 8.4% NaHCO3  Local Anesthesia: 10 cc Lidocaine HCL and NaHCO3 (ratio 2:1)  15 watts continuous mode        Total energy: 710   Total time: :47    Stab Phlebectomy: >20 Sites: Thigh and Calf  Patient tolerated procedure well  Notes:   Description of Procedure:  After marking the course of the secondary varicosities, the patient was placed on the operating table in the supine position, and the left leg was prepped and draped in sterile fashion.   Local anesthetic was administered and under ultrasound guidance the saphenous vein was accessed with a micro needle and guide wire; then the mirco puncture sheath was placed.  A guide wire was inserted saphenofemoral junction , followed by a 5 french sheath.  The position of the sheath and then the laser fiber below the junction was confirmed using the ultrasound.  Tumescent anesthesia was administered along the course of the saphenous vein using ultrasound guidance. The patient was placed in Trendelenburg position and protective laser glasses were placed on patient and staff, and the laser was fired at 15 watts continuous mode advancing 1-26mm/second for a total of 710 joules.   For stab phlebectomies, local anesthetic was administered at the previously marked varicosities, and tumescent anesthesia was administered around the vessels.  Greater than 20 stab wounds were made using the tip of an 11 blade. And using the vein hook, the phlebectomies were performed using a hemostat to avulse the varicosities.  Adequate hemostasis was achieved.       Steri strips were applied to the stab wounds and ABD pads and thigh high compression stockings were applied.  Ace wrap bandages were applied over the phlebectomy sites and at the top of the saphenofemoral junction. Blood loss was less than 15 cc.  The patient ambulated out of the operating room having tolerated the procedure well.

## 2018-09-03 NOTE — Progress Notes (Signed)
Patient name: Joanna Floyd MRN: [id number] DOB: 1956/02/14 Sex: female  REASON FOR VISIT:   For endovenous laser ablation of the left great saphenous vein and greater than 20 stab phlebectomies  HPI:   Joanna Floyd is a pleasant 62 y.o. female who I last saw on 06/24/2018.  She was having significant pain from the varicose veins in her left lower extremity.  She had failed conservative treatment including thigh-high compression stockings with a gradient of 20-30, ibuprofen, and leg elevation.  On the left side, which is the more symptomatic side, I felt she was a candidate for endovenous laser ablation of the great saphenous vein in the proximal thigh.  She would also require greater than 20 stab phlebectomies and would also be a good candidate for sclerotherapy after this.  The plan was then for subsequent via staged endovenous laser ablation of the accessory saphenous vein on the right with 10-20 stab phlebectomies.  Current Outpatient Medications  Medication Sig Dispense Refill  . clonazePAM (KLONOPIN) 0.5 MG tablet Take 0.5 mg by mouth 2 (two) times daily as needed for anxiety.    . Colloidal Oatmeal (ECZEMA MOISTURIZING) 1 % LOTN Apply topically.    Marland Kitchen Hyoscyamine Sulfate 0.375 MG TBCR Take by mouth 2 (two) times daily.     No current facility-administered medications for this visit.     REVIEW OF SYSTEMS:  [X]  denotes positive finding, [ ]  denotes negative finding Vascular    Leg swelling    Cardiac    Chest pain or chest pressure:    Shortness of breath upon exertion:    Short of breath when lying flat:    Irregular heart rhythm:    Constitutional    Fever or chills:     PHYSICAL EXAM:   Vitals:   09/03/18 1053  BP: (!) 179/86  Pulse: 83  Resp: 18  Temp: 98 F (36.7 C)  SpO2: 100%  Weight: 193 lb (87.5 kg)  Height: 5' 2.5" (1.588 m)    GENERAL: The patient is a well-nourished female, in no acute distress. The vital signs are documented above.  DATA:   No new  data  MEDICAL ISSUES:   ENDOVENOUS LASER ABLATION LEFT GREAT SAPHENOUS VEIN WITH GREATER THAN 20 STAB PHLEBECTOMIES: The patient was brought to the exam room.  The veins were marked with the patient standing.  The left leg was then prepped and draped in usual sterile fashion after I looked at the saphenous vein with the SonoSite.  The distal segment had been stripped. After the skin was anesthetized the great saphenous vein was cannulated in the proximal thigh and a micropuncture sheath introduced over a micropuncture wire.  I then advanced the J-wire up to the saphenofemoral junction.  The sheath was advanced to just below the saphenofemoral junction and the dilator removed.  The length of the fiber was measured using the J-wire as a guide.  The the fiber was positioned below the saphenofemoral junction.  Next tumescent anesthesia was administered around the entire circumference of the saphenous vein.  Laser was then performed from below the saphenofemoral junction to the cannulation site.  A total of 710 J of energy were used.  Attention was then turned to stab phlebectomies.  Tumescent anesthesia with his was administered.  Using greater than 20 small stab incisions with an 11 blade the vein was teased above the surface using a hook and then bluntly removed using hemostat and pressure held for hemostasis.  Sterile dressing was  applied.  Patient tolerated procedure well.  Waverly Ferrari [facility] [phone]

## 2018-09-07 ENCOUNTER — Encounter: Payer: Self-pay | Admitting: Vascular Surgery

## 2018-09-08 ENCOUNTER — Telehealth: Payer: Self-pay | Admitting: Podiatry

## 2018-09-08 NOTE — Telephone Encounter (Signed)
Patient has some questions about ingrown toenail removal.

## 2018-09-08 NOTE — Telephone Encounter (Signed)
Left message informing pt I would call again with information concerning the ingrown toenail removal and answer questions.

## 2018-09-09 NOTE — Telephone Encounter (Signed)
Left message requesting pt call to discuss ingrown toenail procedure.

## 2018-09-10 ENCOUNTER — Other Ambulatory Visit: Payer: Self-pay

## 2018-09-10 ENCOUNTER — Ambulatory Visit (HOSPITAL_COMMUNITY)
Admission: RE | Admit: 2018-09-10 | Discharge: 2018-09-10 | Disposition: A | Discharge status: Home / Self Care | Payer: BC Managed Care – PPO | Source: Ambulatory Visit | Attending: Vascular Surgery | Admitting: Vascular Surgery

## 2018-09-10 ENCOUNTER — Encounter: Payer: Self-pay | Admitting: Vascular Surgery

## 2018-09-10 ENCOUNTER — Ambulatory Visit (INDEPENDENT_AMBULATORY_CARE_PROVIDER_SITE_OTHER): Payer: BC Managed Care – PPO | Admitting: Vascular Surgery

## 2018-09-10 VITALS — BP 141/79 | HR 55 | Temp 96.8°F | Resp 16 | Ht 62.5 in | Wt 193.0 lb

## 2018-09-10 DIAGNOSIS — Z48812 Encounter for surgical aftercare following surgery on the circulatory system: Secondary | ICD-10-CM

## 2018-09-10 DIAGNOSIS — I83813 Varicose veins of bilateral lower extremities with pain: Secondary | ICD-10-CM | POA: Diagnosis present

## 2018-09-10 NOTE — Progress Notes (Signed)
   Patient name: Joanna Floyd MRN: [id number] DOB: 01-15-1956 Sex: female  REASON FOR VISIT:   Follow-up after endovenous laser ablation of the left great saphenous vein with greater than 20 stab phlebectomies  HPI:   Joanna Floyd is a pleasant 62 y.o. female who underwent endovenous laser ablation of the left great saphenous vein and greater than 20 stab phlebectomies on 09/03/2018.  She comes in for her first follow-up visit.  She has no specific complaints today.  Current Outpatient Medications  Medication Sig Dispense Refill  . clonazePAM (KLONOPIN) 0.5 MG tablet Take 0.5 mg by mouth 2 (two) times daily as needed for anxiety.    . Colloidal Oatmeal (ECZEMA MOISTURIZING) 1 % LOTN Apply topically.    Marland Kitchen Hyoscyamine Sulfate 0.375 MG TBCR Take by mouth 2 (two) times daily.     No current facility-administered medications for this visit.     REVIEW OF SYSTEMS:  [X]  denotes positive finding, [ ]  denotes negative finding Vascular    Leg swelling    Cardiac    Chest pain or chest pressure:    Shortness of breath upon exertion:    Short of breath when lying flat:    Irregular heart rhythm:    Constitutional    Fever or chills:     PHYSICAL EXAM:   Vitals:   09/10/18 0842  BP: (!) 141/79  Pulse: (!) 55  Resp: 16  Temp: (!) 96.8 F (36 C)  TempSrc: Oral  SpO2: 100%  Weight: 193 lb (87.5 kg)  Height: 5' 2.5" (1.588 m)    GENERAL: The patient is a well-nourished female, in no acute distress. The vital signs are documented above. CARDIOVASCULAR: There is a regular rate and rhythm. PULMONARY: There is good air exchange bilaterally without wheezing or rales. She has some mild bruising in the medial thigh.  Her Steri-Strips are intact. She has mild swelling.  DATA:   VENOUS DUPLEX: I have independently interpreted her venous duplex scan today.  There is no evidence of DVT.  The great saphenous vein was successfully closed up to 1.28 cm from the saphenofemoral  junction.  MEDICAL ISSUES:   STATUS POST ENDOVENOUS LASER ABLATION OF LEFT GREAT SAPHENOUS VEIN AND GREATER THAN 20 STAB PHLEBECTOMIES: Patient is doing well after her procedure last week.  She had successful closure of the vein with no evidence of DVT.  She is doing well and will return next week for stab phlebectomies of the varicose veins of the right leg.  We also discussed potentially doing endovenous laser ablation of the accessory saphenous vein on the right.   Waverly Ferrari [facility] [phone]

## 2018-09-11 NOTE — Telephone Encounter (Signed)
Left message informing pt Dr. Ardelle Anton would explain each ingrown toenail procedure and care at the time of the appt, and she would benefit from wearing loose fitting or open toed shoes to the appt if the procedure would be performed that day.

## 2018-09-17 ENCOUNTER — Encounter: Payer: Self-pay | Admitting: Vascular Surgery

## 2018-09-17 ENCOUNTER — Ambulatory Visit (INDEPENDENT_AMBULATORY_CARE_PROVIDER_SITE_OTHER): Payer: BC Managed Care – PPO | Admitting: Vascular Surgery

## 2018-09-17 ENCOUNTER — Ambulatory Visit: Payer: BC Managed Care – PPO | Admitting: Vascular Surgery

## 2018-09-17 VITALS — BP 160/82 | HR 69 | Temp 98.0°F | Resp 16 | Ht 62.5 in | Wt 193.0 lb

## 2018-09-17 DIAGNOSIS — I83813 Varicose veins of bilateral lower extremities with pain: Secondary | ICD-10-CM | POA: Diagnosis not present

## 2018-09-17 DIAGNOSIS — I868 Varicose veins of other specified sites: Secondary | ICD-10-CM | POA: Diagnosis not present

## 2018-09-17 NOTE — Progress Notes (Signed)
Patient name: Joanna Floyd MRN: [id number] DOB: 07-18-1956 Sex: female  REASON FOR VISIT:   For endovenous laser ablation of the anterior accessory saphenous vein on the right with 10-20 stab phlebectomies right leg.  HPI:   Joanna Floyd is a pleasant 62 y.o. female who underwent laser ablation of the left great saphenous vein with greater than 20 stab phlebectomies on 09/03/2018.  I last saw her on 09/10/2018 and she had no evidence of DVT in the left leg with successful closure of the left great saphenous vein up to 1.3 cm from the saphenofemoral junction.  She has painful varicose veins of the right lower extremity and presents now for laser ablation of the right anterior accessory saphenous vein and 10-20 stab phlebectomies.  When I saw her on 06/24/2018, on the right side she had some varicose veins in the medial right thigh and also the anterior right leg.  She also has spider veins in the posterior thigh and posterior calf.  I did look at her right anterior accessory saphenous vein and this too was incompetent.  It appeared that this could be accessed in the proximal thigh.   Current Outpatient Medications  Medication Sig Dispense Refill  . clonazePAM (KLONOPIN) 0.5 MG tablet Take 0.5 mg by mouth 2 (two) times daily as needed for anxiety.    . Colloidal Oatmeal (ECZEMA MOISTURIZING) 1 % LOTN Apply topically.    Marland Kitchen Hyoscyamine Sulfate 0.375 MG TBCR Take by mouth 2 (two) times daily.     No current facility-administered medications for this visit.     REVIEW OF SYSTEMS:  [X]  denotes positive finding, [ ]  denotes negative finding Vascular    Leg swelling    Cardiac    Chest pain or chest pressure:    Shortness of breath upon exertion:    Short of breath when lying flat:    Irregular heart rhythm:    Constitutional    Fever or chills:     PHYSICAL EXAM:   Vitals:   09/17/18 0858  BP: (!) 160/82  Pulse: 69  Resp: 16  Temp: 98 F (36.7 C)  SpO2: 99%  Weight: 193 lb (87.5 kg)   Height: 5' 2.5" (1.588 m)    GENERAL: The patient is a well-nourished female, in no acute distress. The vital signs are documented above.   DATA:   No new data  MEDICAL ISSUES:   ENDOVENOUS LASER ABLATION RIGHT ANTERIOR ACCESSORY SAPHENOUS VEIN AND 10-20 STAB PHLEBECTOMIES: The patient was brought to the exam room.  I marked remains with the patient standing.  The patient was then placed supine in I looked at the anterior accessory saphenous vein with the SonoSite and felt that it could be best be cannulated in the proximal thigh.  The right leg was then prepped and draped in usual sterile fashion.  After the skin was anesthetized, under ultrasound guidance, the anterior accessory saphenous vein was cannulated with a micropuncture needle and a micropuncture sheath introduced over wire.  I then advanced the J-wire to the proximal anterior accessory saphenous vein well away from the common femoral vein.  The sheath was then positioned in the same location and the wire and dilator were removed.  Tumescent anesthesia was then administered along the entire course of the anterior sensory vein.  Position of the catheter was then again confirmed and the laser exposed by retracting the sheath.  Endovenous laser ablation was performed.  A total of 474 J were administered.  Attention  was then turned to stab phlebectomies.  Tumescent anesthesia was administered.  Using small incisions with an 11 blade approximately 20 small stabs were made and the hook was used to retract the vein into the wound and then the veins were gently evulsed with hemostats.  A sterile dressing was applied.  Patient tolerated procedure well.  Patient will return in 1 week for follow-up duplex.  Waverly Ferrari [facility] [phone]

## 2018-09-17 NOTE — Progress Notes (Signed)
   Laser Ablation Procedure    Date: 09/17/2018    ANVITA HIRATA DOB:09/16/1956  Consent signed: Yes  SurgeonC. Edilia Bo, MD  Procedure: Laser Ablation: right Greater Saphenous Vein Accessory Branch  BP (!) 160/82   Pulse 69   Temp 98 F (36.7 C)   Resp 16   Ht 5' 2.5" (1.588 m)   Wt 193 lb (87.5 kg)   SpO2 99%   BMI 34.74 kg/m   Start:9am    End time: 10:30am  Tumescent Anesthesia: 230 cc 0.9% NaCl with 50 cc Lidocaine HCL with 1% Epi and 15 cc 8.4% NaHCO3  Local Anesthesia: 8 cc Lidocaine HCL and NaHCO3 (ratio 2:1)  15 watts/continuous  Total energy: 474.19, Total pulses: 0, Total time: :31     Stab Phlebectomy: 10-20 Sites: Thigh and Calf  Patient tolerated procedure well: Yes  Notes:   Description of Procedure:  After marking the course of the secondary varicosities, the patient was placed on the operating table in the supine position, and the right leg was prepped and draped in sterile fashion.   Local anesthetic was administered and under ultrasound guidance the saphenous vein was accessed with a micro needle and guide wire; then the mirco puncture sheath was place.  A guide wire was inserted saphenofemoral junction , followed by a 5 french sheath.  The position of the sheath and then the laser fiber below the junction was confirmed using the ultrasound.  Tumescent anesthesia was administered along the course of the saphenous vein using ultrasound guidance. The patient was placed in Trendelenburg position and protective laser glasses were placed on patient and staff, and the laser was fired at at 15 watt continuous mode for a total of 474.19 joules.   For stab phlebectomies, local anesthetic was administered at the previously marked varicosities, and tumescent anesthesia was administered around the vessels.  Ten to 20 stab wounds were made using the tip of an 11 blade. And using the vein hook, the phlebectomies were performed using a hemostat to avulse the  varicosities.  Adequate hemostasis was achieved.     Steri strips were applied to the stab wounds and ABD pads and thigh high compression stockings were applied.  Ace wrap bandages were applied over the phlebectomy sites and at the top of the saphenofemoral junction. Blood loss was less than 15 cc.  The patient ambulated out of the operating room having tolerated the procedure well.

## 2018-09-18 ENCOUNTER — Telehealth: Payer: Self-pay | Admitting: *Deleted

## 2018-09-18 NOTE — Telephone Encounter (Signed)
Pt states she did receive all 3 of my calls and doesn't know why her phone did not alert her, but she has some question and has some questions prior to her 10/09/2018 appt, to call when I got a chance.

## 2018-09-21 NOTE — Telephone Encounter (Signed)
Left message for pt to call to discuss her up coming appt.

## 2018-09-23 NOTE — Telephone Encounter (Signed)
Pt called states she has questions about her visit on 10/09/2018.

## 2018-09-23 NOTE — Telephone Encounter (Signed)
Pt called and I answered the phone. Pt asked to explain the ingrown toenail procedure. I explained she would discuss the procedure type with her doctor, whether temporary or permanent, then the doctor would numb the toe, the assistant would set up for the procedure, cleanse the toe and exsanguinate and place a tourniquet around the toe, then the doctor would come in tighten the tourniquet, perform the procedure they had discussed with or without chemical application and flush, apply an antibiotic cream, and a dressing, soaking instructions will be given at checkout. I explained to pt she would soak the toes for 2-3 weeks for 20 minutes/session and apply either a drop if the procedure was permanent or ointment if not permanent, until the procedure area has a dry hard scab without redness, swelling, drainage or pain. Pt states understanding.

## 2018-09-24 ENCOUNTER — Encounter: Payer: Self-pay | Admitting: Vascular Surgery

## 2018-09-24 ENCOUNTER — Ambulatory Visit (INDEPENDENT_AMBULATORY_CARE_PROVIDER_SITE_OTHER): Payer: BC Managed Care – PPO | Admitting: Vascular Surgery

## 2018-09-24 ENCOUNTER — Ambulatory Visit (HOSPITAL_COMMUNITY)
Admission: RE | Admit: 2018-09-24 | Discharge: 2018-09-24 | Disposition: A | Discharge status: Home / Self Care | Payer: BC Managed Care – PPO | Source: Ambulatory Visit | Attending: Vascular Surgery | Admitting: Vascular Surgery

## 2018-09-24 VITALS — BP 151/76 | HR 57 | Temp 97.0°F | Resp 14 | Ht 62.0 in | Wt 193.0 lb

## 2018-09-24 DIAGNOSIS — I83813 Varicose veins of bilateral lower extremities with pain: Secondary | ICD-10-CM

## 2018-09-24 NOTE — Progress Notes (Signed)
Patient is a 62 year old female who returns today for postoperative follow-up.  She underwent staged left greater saphenous laser ablation with stab avulsions followed by laser ablation of an anterior accessory saphenous on the right with stab avulsions.  She reports she is recovering well.  She has no significant swelling.  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  She also still has a few varicosities especially on her inner thigh bilaterally and wonders whether or not these could be treated at some point in the future.  Physical exam:  Vitals:   09/24/18 0952  BP: (!) 151/76  Pulse: (!) 57  Resp: 14  Temp: (!) 97 F (36.1 C)  TempSrc: Oral  SpO2: 99%  Weight: 193 lb (87.5 kg)  Height: 5\' 2"  (1.575 m)    Right lower extremity: Healing stab incisions mild ecchymosis right medial thigh no hematoma or erythema  Left lower extremity: Few areas of bulging varicosities adjacent to the left medial knee area.  These are 3 to 4 mm diameter  Data: Patient had a duplex ultrasound today which shows occlusion of the accessory saphenous on the right side within 1 cm of the femoral vein.  Assessment: Successful laser ablation of right and left superficial venous system with stab avulsions.  Overall she is doing well.  Plan: Patient is scheduled for sclerotherapy of both legs in November and December of this year.  She will follow-up on an as-needed basis if 6 months to a year from now she has recurrence or worsening of the varicosities in her thigh area.  Fabienne Bruns, MD [facility] Office: [phone] Pager: [phone]

## 2018-10-09 ENCOUNTER — Ambulatory Visit: Payer: BC Managed Care – PPO | Admitting: Podiatry

## 2018-10-09 ENCOUNTER — Ambulatory Visit (INDEPENDENT_AMBULATORY_CARE_PROVIDER_SITE_OTHER): Payer: BC Managed Care – PPO

## 2018-10-09 DIAGNOSIS — M779 Enthesopathy, unspecified: Secondary | ICD-10-CM

## 2018-10-09 DIAGNOSIS — M7741 Metatarsalgia, right foot: Secondary | ICD-10-CM

## 2018-10-09 DIAGNOSIS — M79671 Pain in right foot: Secondary | ICD-10-CM

## 2018-10-09 DIAGNOSIS — L600 Ingrowing nail: Secondary | ICD-10-CM | POA: Diagnosis not present

## 2018-10-09 NOTE — Patient Instructions (Signed)
Look at getting fungi-nail to apply to the nails daily

## 2018-10-14 NOTE — Progress Notes (Signed)
Subjective:   Patient ID: Joanna Floyd, female   DOB: 62 y.o.   MRN: [id number]   HPI 62 year old female presents the office today for concerns of pain to the top of the right foot.  She states she has pain when standing describes a sharp pain at times.  She states is been ongoing for last 3 years and been gradually getting worse.  She denies any recent injury or trauma or any significant swelling or redness.  She also states that she has ingrown toenails to both of her big toes.  These cause pain with pressure in shoes as well.   Review of Systems  All other systems reviewed and are negative.  Past Medical History:  Diagnosis Date  . Varicose veins of both lower extremities     Past Surgical History:  Procedure Laterality Date  . varicose veins Bilateral      Current Outpatient Medications:  .  calcium carbonate (OS-CAL) 1250 (500 Ca) MG chewable tablet, Chew by mouth., Disp: , Rfl:  .  clonazePAM (KLONOPIN) 0.5 MG tablet, Take 0.5 mg by mouth 2 (two) times daily as needed for anxiety., Disp: , Rfl:  .  Colloidal Oatmeal (ECZEMA MOISTURIZING) 1 % LOTN, Apply topically., Disp: , Rfl:  .  Hyoscyamine Sulfate 0.375 MG TBCR, Take by mouth 2 (two) times daily., Disp: , Rfl:   No Known Allergies      Objective:  Physical Exam  General: AAO x3, NAD  Dermatological: Bilateral hallux toenails are mildly hypertrophic, dystrophic with discoloration and there is incurvation of the nail corners.  There is no edema, erythema, drainage or pus or any clinical signs of infection noted today.  No open lesions.  Vascular: Dorsalis Pedis artery and Posterior Tibial artery pedal pulses are 2/4 bilateral with immedate capillary fill time. There is no pain with calf compression, swelling, warmth, erythema.   Neruologic: Grossly intact via light touch bilateral.  Protective threshold with Semmes Wienstein monofilament intact to all pedal sites bilateral.   Musculoskeletal: Mild tenderness palpation  submetatarsal area.  There is a small bursa of fluid present submetatarsal 2 area there is no palpable neuroma and there is no numbness or tingling or sharp pains in the toes.  There is no area pinpoint bony tenderness or pain to vibratory sensation.  Muscular strength 5/5 in all groups tested bilateral.  Gait: Unassisted, Nonantalgic.       Assessment:   Metatarsalgia/capsulitis right foot with ingrown toenails bilateral hallux     Plan:  -Treatment options discussed including all alternatives, risks, and complications -Etiology of symptoms were discussed -X-rays were obtained and reviewed with the patient.  There is no evidence of acute fracture or stress fracture identified today. -She declined steroid injection.  Metatarsal offloading pads were dispensed.  Discussed wearing a stiffer soled shoe. -We discussed partial nail avulsion but again she wants to hold off on this.  She states that today she does want to have a consultation in regards to her issues.  As a courtesy I did debride the toenails lightly without any complications or bleeding.  Vivi BarrackMatthew R Sriansh Farra DPM

## 2018-10-20 ENCOUNTER — Ambulatory Visit: Payer: BC Managed Care – PPO | Admitting: *Deleted

## 2018-10-20 ENCOUNTER — Ambulatory Visit (INDEPENDENT_AMBULATORY_CARE_PROVIDER_SITE_OTHER): Payer: BC Managed Care – PPO | Admitting: *Deleted

## 2018-10-20 DIAGNOSIS — I83813 Varicose veins of bilateral lower extremities with pain: Secondary | ICD-10-CM

## 2018-10-20 NOTE — Progress Notes (Signed)
X=.3% Sotradecol administered with a 27g butterfly.  Patient received a total of 17cc.  Treated all areas of concern. Easy access. Tolerated well. She may not need her 2nd tx. We'll see her tho just in case. Follow prn.  Photos: Yes.    Compression stockings applied: Yes.

## 2018-10-21 ENCOUNTER — Encounter: Payer: Self-pay | Admitting: *Deleted

## 2018-10-23 ENCOUNTER — Ambulatory Visit: Payer: BC Managed Care – PPO

## 2018-10-27 ENCOUNTER — Encounter: Payer: BC Managed Care – PPO | Admitting: Vascular Surgery

## 2018-11-17 ENCOUNTER — Ambulatory Visit: Payer: BC Managed Care – PPO | Admitting: *Deleted

## 2018-11-17 ENCOUNTER — Ambulatory Visit (INDEPENDENT_AMBULATORY_CARE_PROVIDER_SITE_OTHER): Payer: BC Managed Care – PPO | Admitting: *Deleted

## 2018-11-17 DIAGNOSIS — I83893 Varicose veins of bilateral lower extremities with other complications: Secondary | ICD-10-CM

## 2018-11-17 DIAGNOSIS — I83813 Varicose veins of bilateral lower extremities with pain: Secondary | ICD-10-CM

## 2018-11-17 NOTE — Progress Notes (Signed)
X=.3% Sotradecol administered with a 27g butterfly.  Patient received a total of 6cc.  Treated any remaining open spiders. Easy access. Tol well. Healing well from prior tx. Anticipate good results. Follow prn.  Photos: No  Compression stockings applied: Yes.

## 2018-11-18 ENCOUNTER — Encounter: Payer: Self-pay | Admitting: Vascular Surgery

## 2018-11-20 ENCOUNTER — Ambulatory Visit: Payer: BC Managed Care – PPO

## 2019-11-10 ENCOUNTER — Other Ambulatory Visit: Payer: Self-pay | Admitting: Obstetrics and Gynecology

## 2019-11-10 DIAGNOSIS — Z803 Family history of malignant neoplasm of breast: Secondary | ICD-10-CM

## 2019-12-14 ENCOUNTER — Other Ambulatory Visit: Payer: Self-pay

## 2020-01-04 DIAGNOSIS — M7989 Other specified soft tissue disorders: Secondary | ICD-10-CM

## 2020-03-09 ENCOUNTER — Ambulatory Visit: Payer: BC Managed Care – PPO | Attending: Internal Medicine

## 2020-03-09 DIAGNOSIS — Z23 Encounter for immunization: Secondary | ICD-10-CM

## 2020-03-09 NOTE — Progress Notes (Signed)
   Covid-19 Vaccination Clinic  Name:  Joanna Floyd    MRN: [id number] DOB: 1956/10/12  03/09/2020  [name] was observed post Covid-19 immunization for 15 minutes without incident. She was provided with Vaccine Information Sheet and instruction to access the V-Safe system.   [name] was instructed to call 911 with any severe reactions post vaccine: Marland Kitchen Difficulty breathing  . Swelling of face and throat  . A fast heartbeat  . A bad rash all over body  . Dizziness and weakness   Immunizations Administered    Name Date Dose VIS Date Route   Pfizer COVID-19 Vaccine 03/09/2020  4:01 PM 0.3 mL 11/12/2019 Intramuscular   Manufacturer: [employer], [employer]   Lot: [id number]   NDC: [address]-5

## 2020-04-04 ENCOUNTER — Ambulatory Visit: Payer: BC Managed Care – PPO | Attending: Internal Medicine

## 2020-04-04 DIAGNOSIS — Z23 Encounter for immunization: Secondary | ICD-10-CM

## 2020-04-04 NOTE — Progress Notes (Signed)
   Covid-19 Vaccination Clinic  Name:  SUNJAI LEVANDOSKI    MRN: [id number] DOB: 01-18-1956  04/04/2020  Ms. Hesch was observed post Covid-19 immunization for 15 minutes without incident. She was provided with Vaccine Information Sheet and instruction to access the V-Safe system.   Ms. Lauter was instructed to call 911 with any severe reactions post vaccine: Marland Kitchen Difficulty breathing  . Swelling of face and throat  . A fast heartbeat  . A bad rash all over body  . Dizziness and weakness   Immunizations Administered    Name Date Dose VIS Date Route   Pfizer COVID-19 Vaccine 04/04/2020 10:55 AM 0.3 mL 01/26/2019 Intramuscular   Manufacturer: [employer], [employer]   Lot: [id number]   NDC: [address]-5

## 2020-07-06 ENCOUNTER — Other Ambulatory Visit: Payer: Self-pay

## 2020-07-06 ENCOUNTER — Ambulatory Visit (INDEPENDENT_AMBULATORY_CARE_PROVIDER_SITE_OTHER): Payer: BC Managed Care – PPO | Admitting: Plastic Surgery

## 2020-07-06 ENCOUNTER — Encounter: Payer: Self-pay | Admitting: Plastic Surgery

## 2020-07-06 VITALS — BP 149/75 | HR 56 | Temp 98.2°F | Ht 62.0 in | Wt 201.2 lb

## 2020-07-06 DIAGNOSIS — L304 Erythema intertrigo: Secondary | ICD-10-CM

## 2020-07-06 DIAGNOSIS — N62 Hypertrophy of breast: Secondary | ICD-10-CM

## 2020-07-06 NOTE — Progress Notes (Signed)
Referring Provider Rankins, Fanny Dance, MD [address] [place],  [place] [address]   CC:  Chief Complaint  Patient presents with  . Consult      Joanna Floyd is an 64 y.o. female.  HPI: Patient is here to discuss a number of issues.  She is particularly bothered by the width of her upper torso.  She feels like her breast to have gotten longer in the crease beneath has been chronically irritated by moisture.  She is tried over-the-counter and prescription medications from her primary care physician to treat this with out sustained relief.  She is also particularly bothered by the excess tissue that extends from her axilla around her back and wanted to see if anything can be done to address that.  She is up-to-date on her mammograms.  She has not had any previous concerning findings or biopsies.  She would like to be a little bit smaller than she is now but lifted.  Additionally she wanted to point out her medial thighs to see if anything can be done in that area.  She has had vein stripping before but feels like there is some contour irregularities that she wanted to see if anything can be done for her.  No Known Allergies  Outpatient Encounter Medications as of 07/06/2020  Medication Sig  . calcium carbonate (OS-CAL) 1250 (500 Ca) MG chewable tablet Chew by mouth.  . Colloidal Oatmeal (ECZEMA MOISTURIZING) 1 % LOTN Apply topically.  . dicyclomine (BENTYL) 20 MG tablet Take by mouth.  . hydrocortisone 2.5 % cream hydrocortisone 2.5 % topical cream  APPLY BID  . Hyoscyamine Sulfate 0.375 MG TBCR Take by mouth 2 (two) times daily.  Marland Kitchen omeprazole (PRILOSEC) 40 MG capsule Take by mouth.  . triamcinolone cream (KENALOG) 0.1 % Apply topically.  . clonazePAM (KLONOPIN) 0.5 MG tablet Take 0.5 mg by mouth 2 (two) times daily as needed for anxiety. (Patient not taking: Reported on 07/06/2020)   No facility-administered encounter medications on file as of 07/06/2020.     Past Medical History:    Diagnosis Date  . Varicose veins of both lower extremities     Past Surgical History:  Procedure Laterality Date  . varicose veins Bilateral     No family history on file.  Social History   Social History Narrative  . Not on file  Denies tobacco use  Review of Systems General: Denies fevers, chills, weight loss CV: Denies chest pain, shortness of breath, palpitations   Physical Exam Vitals with BMI 07/06/2020 09/24/2018 09/17/2018  Height 5\' 2"  5\' 2"  5' 2.5"  Weight 201 lbs 3 oz 193 lbs 193 lbs  BMI 36.79 35.29 34.72  Systolic 149 151  Diastolic 75 76 82  Pulse 56 57 69    General:  No acute distress,  Alert and oriented, Non-Toxic, Normal speech and affect Breast: She has grade 3 ptosis.  Sternal notch to nipple is 32 cm bilaterally.  Nipple to fold is 15 cm bilaterally.  I do not see any obvious scars or masses.  She has quite a bit of excess skin and adipose tissue in the axilla and this extends around onto her upper back.  The fullness and roll extending around her upper back is a primary concern of hers.  I do not see any obvious scars in these areas. Thighs: She has a bit of skin irregularity in her thighs in terms of the contour that is diffuse.  She has scattered varicose  veins that I think are contributing to this.  She has a few scars in the medial aspect of the left thigh that are consistent with prior vein stripping.  Assessment/Plan The patient has bilateral symptomatic macromastia.  She is a good candidate for a breast reduction.  She is interested in pursuing surgical treatment.  The details of breast reduction surgery were discussed.  I explained the procedure in detail along the with the expected scars.  The risks were discussed in detail and include bleeding, infection, damage to surrounding structures, need for additional procedures, nipple loss, change in nipple sensation, persistent pain, contour irregularities and asymmetries.  I explained that breast  feeding is often not possible after breast reduction surgery.  We discussed the expected postoperative course with an overall recovery period of about 1 month.  She demonstrated full understanding of all risks.  We discussed her personal risk factors.  I anticipate approximately 650 g of tissue removed from each side.  Regarding her upper back I think that this would be most directly addressed with an upper back lift.  I would perform the breast reduction and then flip her prone and continued the excision around the upper torso.  I think this would be the only way to truly address the upper back skin excess.  I explained that in my opinion liposuction alone would not result in contraction of the skin and may result in a increase in the perceived fold due to deflation of the current skin envelope.  I explained the location of the scar and the need for drains.  I explained the expected recovery time.  Regarding the medial thighs I am not sure that I have a good solution for her.  I feel like liposuction would not result in an improved appearance and may actually make it worse.  Additionally by retracting the skin in the method that could be done for a upper thigh or total thigh lift I am not sure it would be worth the scar in her case as a lot of the issues that are concerning her would still be present.  We will plan to be in touch with her regarding the breast and upper torso with a quote.  She should be a candidate for insurance coverage for the breast given her rashes that have not responded to conservative measures.   Allena Napoleon 07/06/2020, 4:39 PM

## 2020-07-28 ENCOUNTER — Telehealth: Payer: Self-pay | Admitting: Plastic Surgery

## 2020-07-28 NOTE — Telephone Encounter (Signed)
Call placed earlier to Ms. Sabia regarding surgery request to insurance. Ms. Bose stated that she did not realize that something had been submitted to insurance because she did not want surgery, she was there for a consultation only. I expressed my understanding and acknowledged her response. She expressed her appreciation for the call and update.

## 2021-04-03 DIAGNOSIS — M7989 Other specified soft tissue disorders: Secondary | ICD-10-CM

## 2021-09-27 DIAGNOSIS — L400 Psoriasis vulgaris: Secondary | ICD-10-CM | POA: Diagnosis not present

## 2021-10-29 DIAGNOSIS — R059 Cough, unspecified: Secondary | ICD-10-CM | POA: Diagnosis not present

## 2021-10-29 DIAGNOSIS — Z03818 Encounter for observation for suspected exposure to other biological agents ruled out: Secondary | ICD-10-CM | POA: Diagnosis not present

## 2021-10-29 DIAGNOSIS — J069 Acute upper respiratory infection, unspecified: Secondary | ICD-10-CM | POA: Diagnosis not present

## 2021-10-29 DIAGNOSIS — R509 Fever, unspecified: Secondary | ICD-10-CM | POA: Diagnosis not present

## 2021-11-15 DIAGNOSIS — Z01419 Encounter for gynecological examination (general) (routine) without abnormal findings: Secondary | ICD-10-CM | POA: Diagnosis not present

## 2021-11-15 DIAGNOSIS — Z1231 Encounter for screening mammogram for malignant neoplasm of breast: Secondary | ICD-10-CM | POA: Diagnosis not present

## 2021-11-15 DIAGNOSIS — Z6831 Body mass index (BMI) 31.0-31.9, adult: Secondary | ICD-10-CM | POA: Diagnosis not present

## 2021-12-20 DIAGNOSIS — Z23 Encounter for immunization: Secondary | ICD-10-CM | POA: Diagnosis not present

## 2021-12-20 DIAGNOSIS — F419 Anxiety disorder, unspecified: Secondary | ICD-10-CM | POA: Diagnosis not present

## 2021-12-20 DIAGNOSIS — L209 Atopic dermatitis, unspecified: Secondary | ICD-10-CM | POA: Diagnosis not present

## 2021-12-20 DIAGNOSIS — I10 Essential (primary) hypertension: Secondary | ICD-10-CM | POA: Diagnosis not present

## 2021-12-20 DIAGNOSIS — E118 Type 2 diabetes mellitus with unspecified complications: Secondary | ICD-10-CM | POA: Diagnosis not present

## 2021-12-20 DIAGNOSIS — K589 Irritable bowel syndrome without diarrhea: Secondary | ICD-10-CM | POA: Diagnosis not present

## 2021-12-20 DIAGNOSIS — E559 Vitamin D deficiency, unspecified: Secondary | ICD-10-CM | POA: Diagnosis not present

## 2021-12-20 DIAGNOSIS — R252 Cramp and spasm: Secondary | ICD-10-CM | POA: Diagnosis not present

## 2021-12-20 DIAGNOSIS — K219 Gastro-esophageal reflux disease without esophagitis: Secondary | ICD-10-CM | POA: Diagnosis not present

## 2021-12-20 DIAGNOSIS — Z0000 Encounter for general adult medical examination without abnormal findings: Secondary | ICD-10-CM | POA: Diagnosis not present

## 2022-03-05 DIAGNOSIS — L400 Psoriasis vulgaris: Secondary | ICD-10-CM | POA: Diagnosis not present

## 2022-07-02 ENCOUNTER — Ambulatory Visit (INDEPENDENT_AMBULATORY_CARE_PROVIDER_SITE_OTHER): Payer: Medicare PPO

## 2022-07-02 ENCOUNTER — Ambulatory Visit: Payer: Medicare PPO | Admitting: Podiatry

## 2022-07-02 DIAGNOSIS — M722 Plantar fascial fibromatosis: Secondary | ICD-10-CM

## 2022-07-02 DIAGNOSIS — S96911A Strain of unspecified muscle and tendon at ankle and foot level, right foot, initial encounter: Secondary | ICD-10-CM

## 2022-07-02 MED ORDER — MELOXICAM 15 MG PO TABS: 15.0000 mg | ORAL_TABLET | Freq: Every day | ORAL | 0 refills | Status: AC | PRN

## 2022-07-02 MED ORDER — METHYLPREDNISOLONE 4 MG PO TBPK: ORAL_TABLET | ORAL | 0 refills | Status: DC

## 2022-07-02 NOTE — Progress Notes (Signed)
Subjective:   Patient ID: Joanna Floyd, female   DOB: 66 y.o.   MRN: [id number]   HPI Chief Complaint  Patient presents with   Foot Pain    Right heel pain started 6 week  ago, caused by standing or walking a lot, swelling, TX: Ice, Xrays done today    66 year old female presents with above complaint.  She states that it feels swollen.  She states that it started when she was working out with a trainer she reports some itching with her foot.  No immediate swelling or bruising pain started after.  No other injuries.  Review of Systems  All other systems reviewed and are negative.  Past Medical History:  Diagnosis Date   Varicose veins of both lower extremities     Past Surgical History:  Procedure Laterality Date   varicose veins Bilateral      Current Outpatient Medications:    clonazePAM (KLONOPIN) 0.5 MG tablet, Take 0.5 mg by mouth 2 (two) times daily as needed for anxiety., Disp: , Rfl:    Colloidal Oatmeal (ECZEMA MOISTURIZING) 1 % LOTN, Apply topically., Disp: , Rfl:    dicyclomine (BENTYL) 20 MG tablet, Take by mouth., Disp: , Rfl:    hydrocortisone 2.5 % cream, hydrocortisone 2.5 % topical cream  APPLY BID, Disp: , Rfl:    Hyoscyamine Sulfate 0.375 MG TBCR, Take by mouth 2 (two) times daily., Disp: , Rfl:    meloxicam (MOBIC) 15 MG tablet, Take 1 tablet (15 mg total) by mouth daily as needed for pain., Disp: 30 tablet, Rfl: 0   methylPREDNISolone (MEDROL DOSEPAK) 4 MG TBPK tablet, Take as directed, Disp: 21 tablet, Rfl: 0   triamcinolone cream (KENALOG) 0.1 %, Apply topically., Disp: , Rfl:   No Known Allergies        Objective:  Physical Exam  General: AAO x3, NAD  Dermatological: Skin is warm, dry and supple bilateral. There are no open sores, no preulcerative lesions, no rash or signs of infection present.  Vascular: Dorsalis Pedis artery and Posterior Tibial artery pedal pulses are 2/4 bilateral with immedate capillary fill time.  There is no pain with calf  compression, swelling, warmth, erythema.   Neruologic: Grossly intact via light touch bilateral.  Negative Tinel sign.  Musculoskeletal: There is tenderness palpation along the plantar medial tubercle of the right heel just distal to the tuberosity.  There is localized edema but there is no erythema or warmth.  Clinically the tendons appear to be intact.  MMT 5/5.  Muscular strength 5/5 in all groups tested bilateral.  Gait: Unassisted, Nonantalgic.       Assessment:   Plantar fasciitis, muscle strain right heel     Plan:  -Treatment options discussed including all alternatives, risks, and complications. -Etiology of symptoms were discussed -X-rays were obtained and reviewed with the patient.  3 views of the right foot were obtained.  Evidence of acute fracture or stress fracture noted today. -Start with a Medrol Dosepak.  Once complete will start meloxicam to take as needed. -Discussed stretching, icing daily. -Plantar fascial brace dispensed for stabilization of the plantar fascia, musculature. -Supportive shoe gear  Vivi Barrack DPM

## 2022-07-02 NOTE — Patient Instructions (Signed)
You can start with the medrol dose pack (steroid) and once complete you can start the meloxicam (anti-inflammatory)  For instructions on how to put on your Plantar Fascial Brace, please visit [email or web address]   Plantar Fasciitis (Heel Spur Syndrome) with Rehab-- Start these as the pain improves The plantar fascia is a fibrous, ligament-like, soft-tissue structure that spans the bottom of the foot. Plantar fasciitis is a condition that causes pain in the foot due to inflammation of the tissue. SYMPTOMS  Pain and tenderness on the underneath side of the foot. Pain that worsens with standing or walking. CAUSES  Plantar fasciitis is caused by irritation and injury to the plantar fascia on the underneath side of the foot. Common mechanisms of injury include: Direct trauma to bottom of the foot. Damage to a small nerve that runs under the foot where the main fascia attaches to the heel bone. Stress placed on the plantar fascia due to bone spurs. RISK INCREASES WITH:  Activities that place stress on the plantar fascia (running, jumping, pivoting, or cutting). Poor strength and flexibility. Improperly fitted shoes. Tight calf muscles. Flat feet. Failure to warm-up properly before activity. Obesity. PREVENTION Warm up and stretch properly before activity. Allow for adequate recovery between workouts. Maintain physical fitness: Strength, flexibility, and endurance. Cardiovascular fitness. Maintain a health body weight. Avoid stress on the plantar fascia. Wear properly fitted shoes, including arch supports for individuals who have flat feet.  PROGNOSIS  If treated properly, then the symptoms of plantar fasciitis usually resolve without surgery. However, occasionally surgery is necessary.  RELATED COMPLICATIONS  Recurrent symptoms that may result in a chronic condition. Problems of the lower back that are caused by compensating for the injury, such as limping. Pain or weakness of  the foot during push-off following surgery. Chronic inflammation, scarring, and partial or complete fascia tear, occurring more often from repeated injections.  TREATMENT  Treatment initially involves the use of ice and medication to help reduce pain and inflammation. The use of strengthening and stretching exercises may help reduce pain with activity, especially stretches of the Achilles tendon. These exercises may be performed at home or with a therapist. Your caregiver may recommend that you use heel cups of arch supports to help reduce stress on the plantar fascia. Occasionally, corticosteroid injections are given to reduce inflammation. If symptoms persist for greater than 6 months despite non-surgical (conservative), then surgery may be recommended.   MEDICATION  If pain medication is necessary, then nonsteroidal anti-inflammatory medications, such as aspirin and ibuprofen, or other minor pain relievers, such as acetaminophen, are often recommended. Do not take pain medication within 7 days before surgery. Prescription pain relievers may be given if deemed necessary by your caregiver. Use only as directed and only as much as you need. Corticosteroid injections may be given by your caregiver. These injections should be reserved for the most serious cases, because they may only be given a certain number of times.  HEAT AND COLD Cold treatment (icing) relieves pain and reduces inflammation. Cold treatment should be applied for 10 to 15 minutes every 2 to 3 hours for inflammation and pain and immediately after any activity that aggravates your symptoms. Use ice packs or massage the area with a piece of ice (ice massage). Heat treatment may be used prior to performing the stretching and strengthening activities prescribed by your caregiver, physical therapist, or athletic trainer. Use a heat pack or soak the injury in warm water.  SEEK IMMEDIATE MEDICAL CARE IF: Treatment seems to  offer no benefit,  or the condition worsens. Any medications produce adverse side effects.  EXERCISES- RANGE OF MOTION (ROM) AND STRETCHING EXERCISES - Plantar Fasciitis (Heel Spur Syndrome) These exercises may help you when beginning to rehabilitate your injury. Your symptoms may resolve with or without further involvement from your physician, physical therapist or athletic trainer. While completing these exercises, remember:  Restoring tissue flexibility helps normal motion to return to the joints. This allows healthier, less painful movement and activity. An effective stretch should be held for at least 30 seconds. A stretch should never be painful. You should only feel a gentle lengthening or release in the stretched tissue.  RANGE OF MOTION - Toe Extension, Flexion Sit with your right / left leg crossed over your opposite knee. Grasp your toes and gently pull them back toward the top of your foot. You should feel a stretch on the bottom of your toes and/or foot. Hold this stretch for 10 seconds. Now, gently pull your toes toward the bottom of your foot. You should feel a stretch on the top of your toes and or foot. Hold this stretch for 10 seconds. Repeat  times. Complete this stretch 3 times per day.   RANGE OF MOTION - Ankle Dorsiflexion, Active Assisted Remove shoes and sit on a chair that is preferably not on a carpeted surface. Place right / left foot under knee. Extend your opposite leg for support. Keeping your heel down, slide your right / left foot back toward the chair until you feel a stretch at your ankle or calf. If you do not feel a stretch, slide your bottom forward to the edge of the chair, while still keeping your heel down. Hold this stretch for 10 seconds. Repeat 3 times. Complete this stretch 2 times per day.   STRETCH  Gastroc, Standing Place hands on wall. Extend right / left leg, keeping the front knee somewhat bent. Slightly point your toes inward on your back foot. Keeping  your right / left heel on the floor and your knee straight, shift your weight toward the wall, not allowing your back to arch. You should feel a gentle stretch in the right / left calf. Hold this position for 10 seconds. Repeat 3 times. Complete this stretch 2 times per day.  STRETCH  Soleus, Standing Place hands on wall. Extend right / left leg, keeping the other knee somewhat bent. Slightly point your toes inward on your back foot. Keep your right / left heel on the floor, bend your back knee, and slightly shift your weight over the back leg so that you feel a gentle stretch deep in your back calf. Hold this position for 10 seconds. Repeat 3 times. Complete this stretch 2 times per day.  STRETCH  Gastrocsoleus, Standing  Note: This exercise can place a lot of stress on your foot and ankle. Please complete this exercise only if specifically instructed by your caregiver.  Place the ball of your right / left foot on a step, keeping your other foot firmly on the same step. Hold on to the wall or a rail for balance. Slowly lift your other foot, allowing your body weight to press your heel down over the edge of the step. You should feel a stretch in your right / left calf. Hold this position for 10 seconds. Repeat this exercise with a slight bend in your right / left knee. Repeat 3 times. Complete this stretch 2 times per day.   STRENGTHENING EXERCISES - Plantar Fasciitis (  Heel Spur Syndrome)  These exercises may help you when beginning to rehabilitate your injury. They may resolve your symptoms with or without further involvement from your physician, physical therapist or athletic trainer. While completing these exercises, remember:  Muscles can gain both the endurance and the strength needed for everyday activities through controlled exercises. Complete these exercises as instructed by your physician, physical therapist or athletic trainer. Progress the resistance and repetitions only as  guided.  STRENGTH - Towel Curls Sit in a chair positioned on a non-carpeted surface. Place your foot on a towel, keeping your heel on the floor. Pull the towel toward your heel by only curling your toes. Keep your heel on the floor. Repeat 3 times. Complete this exercise 2 times per day.  STRENGTH - Ankle Inversion Secure one end of a rubber exercise band/tubing to a fixed object (table, pole). Loop the other end around your foot just before your toes. Place your fists between your knees. This will focus your strengthening at your ankle. Slowly, pull your big toe up and in, making sure the band/tubing is positioned to resist the entire motion. Hold this position for 10 seconds. Have your muscles resist the band/tubing as it slowly pulls your foot back to the starting position. Repeat 3 times. Complete this exercises 2 times per day.  Document Released: 11/18/2005 Document Revised: 02/10/2012 Document Reviewed: 03/02/2009 [facility] Patient Information 2014 [place], [place].

## 2022-07-11 DIAGNOSIS — I8001 Phlebitis and thrombophlebitis of superficial vessels of right lower extremity: Secondary | ICD-10-CM | POA: Diagnosis not present

## 2022-07-11 DIAGNOSIS — R197 Diarrhea, unspecified: Secondary | ICD-10-CM | POA: Diagnosis not present

## 2022-07-15 ENCOUNTER — Telehealth: Payer: Self-pay | Admitting: *Deleted

## 2022-07-15 NOTE — Telephone Encounter (Signed)
Patient is calling because she has now developed a lump on leg near ankle. Should she be seen for a sooner appointment or wait until scheduled f/u on 09/12.  Please advise

## 2022-07-16 NOTE — Telephone Encounter (Signed)
Scheduled 09/17

## 2022-07-17 NOTE — Telephone Encounter (Signed)
Thank you :)

## 2022-07-18 ENCOUNTER — Ambulatory Visit: Payer: Medicare PPO | Admitting: Podiatry

## 2022-07-18 ENCOUNTER — Ambulatory Visit (HOSPITAL_COMMUNITY)
Admission: RE | Admit: 2022-07-18 | Discharge: 2022-07-18 | Disposition: A | Discharge status: Home / Self Care | Payer: Medicare PPO | Source: Ambulatory Visit | Attending: Surgery | Admitting: Surgery

## 2022-07-18 ENCOUNTER — Telehealth: Payer: Self-pay | Admitting: *Deleted

## 2022-07-18 DIAGNOSIS — I8001 Phlebitis and thrombophlebitis of superficial vessels of right lower extremity: Secondary | ICD-10-CM

## 2022-07-18 NOTE — Telephone Encounter (Signed)
V&V calling with a preliminary report from [place] study,patient is positive for blood clot, was sent home, she said that she was instructed by physician to take an aspirin. Report will be ready to view in epic shortly.

## 2022-07-18 NOTE — Telephone Encounter (Signed)
Patient has been scheduled for today 07/18/22@4 :00,arrival time:3:45 with V&V for US,no prior authorization is required for this [id number]), ref #[id number] Contact: Louise D. Notified Para March w/ V&V. Patient has been notified as well.

## 2022-07-20 NOTE — Progress Notes (Signed)
Subjective: Chief Complaint  Patient presents with   Plantar Fasciitis    Pt stated after wearing her p.f. brace, her right leg has a knot that formed,which started Tuesday, pt went to PCP on Thursday, pt is having only when its is being touched, PCP said is was superficial thrombophlebitis,  pt is still having foot pain, Rate pain 5 out of 10,  TX: pt has stop wearing the brace, Medrol pack (finished)   66 year old female presents the office for the above complaints.  She has developed a knot on the right leg.  This is more along the distal portion of the medial calf.  This is above the area of the plantar fascial brace.  She saw her primary care doctor and was told she had a superficial thrombophlebitis.  Objective: AAO x3, NAD DP/PT pulses palpable bilaterally, CRT less than 3 seconds On the medial aspect of the calf and this is approximately where the brace was as she is got a superficial thrombophlebitis there is localized edema erythema with mild induration.  There is no open lesions.  No pain with calf compression, swelling, warmth, erythema  Assessment: Superficial thrombophlebitis  Plan: -All treatment options discussed with the patient including all alternatives, risks, complications.  -I will order a DVT study to rule out any deeper clots. -Recommend warm compress -Recommend aspirin -Compression -Monitor for any signs or symptoms of DVT  -Patient encouraged to call the office with any questions, concerns, change in symptoms.   Joanna Floyd DPM

## 2022-08-06 ENCOUNTER — Ambulatory Visit (INDEPENDENT_AMBULATORY_CARE_PROVIDER_SITE_OTHER): Payer: Medicare PPO | Admitting: Podiatry

## 2022-08-06 DIAGNOSIS — I8001 Phlebitis and thrombophlebitis of superficial vessels of right lower extremity: Secondary | ICD-10-CM | POA: Diagnosis not present

## 2022-08-06 DIAGNOSIS — M722 Plantar fascial fibromatosis: Secondary | ICD-10-CM | POA: Diagnosis not present

## 2022-08-06 NOTE — Patient Instructions (Signed)
You can also use VOLTAREN GEL on the heel.   Plantar Fasciitis (Heel Spur Syndrome) with Rehab The plantar fascia is a fibrous, ligament-like, soft-tissue structure that spans the bottom of the foot. Plantar fasciitis is a condition that causes pain in the foot due to inflammation of the tissue. SYMPTOMS  Pain and tenderness on the underneath side of the foot. Pain that worsens with standing or walking. CAUSES  Plantar fasciitis is caused by irritation and injury to the plantar fascia on the underneath side of the foot. Common mechanisms of injury include: Direct trauma to bottom of the foot. Damage to a small nerve that runs under the foot where the main fascia attaches to the heel bone. Stress placed on the plantar fascia due to bone spurs. RISK INCREASES WITH:  Activities that place stress on the plantar fascia (running, jumping, pivoting, or cutting). Poor strength and flexibility. Improperly fitted shoes. Tight calf muscles. Flat feet. Failure to warm-up properly before activity. Obesity. PREVENTION Warm up and stretch properly before activity. Allow for adequate recovery between workouts. Maintain physical fitness: Strength, flexibility, and endurance. Cardiovascular fitness. Maintain a health body weight. Avoid stress on the plantar fascia. Wear properly fitted shoes, including arch supports for individuals who have flat feet.  PROGNOSIS  If treated properly, then the symptoms of plantar fasciitis usually resolve without surgery. However, occasionally surgery is necessary.  RELATED COMPLICATIONS  Recurrent symptoms that may result in a chronic condition. Problems of the lower back that are caused by compensating for the injury, such as limping. Pain or weakness of the foot during push-off following surgery. Chronic inflammation, scarring, and partial or complete fascia tear, occurring more often from repeated injections.  TREATMENT  Treatment initially involves the use  of ice and medication to help reduce pain and inflammation. The use of strengthening and stretching exercises may help reduce pain with activity, especially stretches of the Achilles tendon. These exercises may be performed at home or with a therapist. Your caregiver may recommend that you use heel cups of arch supports to help reduce stress on the plantar fascia. Occasionally, corticosteroid injections are given to reduce inflammation. If symptoms persist for greater than 6 months despite non-surgical (conservative), then surgery may be recommended.   MEDICATION  If pain medication is necessary, then nonsteroidal anti-inflammatory medications, such as aspirin and ibuprofen, or other minor pain relievers, such as acetaminophen, are often recommended. Do not take pain medication within 7 days before surgery. Prescription pain relievers may be given if deemed necessary by your caregiver. Use only as directed and only as much as you need. Corticosteroid injections may be given by your caregiver. These injections should be reserved for the most serious cases, because they may only be given a certain number of times.  HEAT AND COLD Cold treatment (icing) relieves pain and reduces inflammation. Cold treatment should be applied for 10 to 15 minutes every 2 to 3 hours for inflammation and pain and immediately after any activity that aggravates your symptoms. Use ice packs or massage the area with a piece of ice (ice massage). Heat treatment may be used prior to performing the stretching and strengthening activities prescribed by your caregiver, physical therapist, or athletic trainer. Use a heat pack or soak the injury in warm water.  SEEK IMMEDIATE MEDICAL CARE IF: Treatment seems to offer no benefit, or the condition worsens. Any medications produce adverse side effects.  EXERCISES- RANGE OF MOTION (ROM) AND STRETCHING EXERCISES - Plantar Fasciitis (Heel Spur Syndrome) These exercises  may help you when  beginning to rehabilitate your injury. Your symptoms may resolve with or without further involvement from your physician, physical therapist or athletic trainer. While completing these exercises, remember:  Restoring tissue flexibility helps normal motion to return to the joints. This allows healthier, less painful movement and activity. An effective stretch should be held for at least 30 seconds. A stretch should never be painful. You should only feel a gentle lengthening or release in the stretched tissue.  RANGE OF MOTION - Toe Extension, Flexion Sit with your right / left leg crossed over your opposite knee. Grasp your toes and gently pull them back toward the top of your foot. You should feel a stretch on the bottom of your toes and/or foot. Hold this stretch for 10 seconds. Now, gently pull your toes toward the bottom of your foot. You should feel a stretch on the top of your toes and or foot. Hold this stretch for 10 seconds. Repeat  times. Complete this stretch 3 times per day.   RANGE OF MOTION - Ankle Dorsiflexion, Active Assisted Remove shoes and sit on a chair that is preferably not on a carpeted surface. Place right / left foot under knee. Extend your opposite leg for support. Keeping your heel down, slide your right / left foot back toward the chair until you feel a stretch at your ankle or calf. If you do not feel a stretch, slide your bottom forward to the edge of the chair, while still keeping your heel down. Hold this stretch for 10 seconds. Repeat 3 times. Complete this stretch 2 times per day.   STRETCH  Gastroc, Standing Place hands on wall. Extend right / left leg, keeping the front knee somewhat bent. Slightly point your toes inward on your back foot. Keeping your right / left heel on the floor and your knee straight, shift your weight toward the wall, not allowing your back to arch. You should feel a gentle stretch in the right / left calf. Hold this position for 10  seconds. Repeat 3 times. Complete this stretch 2 times per day.  STRETCH  Soleus, Standing Place hands on wall. Extend right / left leg, keeping the other knee somewhat bent. Slightly point your toes inward on your back foot. Keep your right / left heel on the floor, bend your back knee, and slightly shift your weight over the back leg so that you feel a gentle stretch deep in your back calf. Hold this position for 10 seconds. Repeat 3 times. Complete this stretch 2 times per day.  STRETCH  Gastrocsoleus, Standing  Note: This exercise can place a lot of stress on your foot and ankle. Please complete this exercise only if specifically instructed by your caregiver.  Place the ball of your right / left foot on a step, keeping your other foot firmly on the same step. Hold on to the wall or a rail for balance. Slowly lift your other foot, allowing your body weight to press your heel down over the edge of the step. You should feel a stretch in your right / left calf. Hold this position for 10 seconds. Repeat this exercise with a slight bend in your right / left knee. Repeat 3 times. Complete this stretch 2 times per day.   STRENGTHENING EXERCISES - Plantar Fasciitis (Heel Spur Syndrome)  These exercises may help you when beginning to rehabilitate your injury. They may resolve your symptoms with or without further involvement from your physician, physical therapist  or Product/process development scientist. While completing these exercises, remember:  Muscles can gain both the endurance and the strength needed for everyday activities through controlled exercises. Complete these exercises as instructed by your physician, physical therapist or athletic trainer. Progress the resistance and repetitions only as guided.  STRENGTH - Towel Curls Sit in a chair positioned on a non-carpeted surface. Place your foot on a towel, keeping your heel on the floor. Pull the towel toward your heel by only curling your toes. Keep your  heel on the floor. Repeat 3 times. Complete this exercise 2 times per day.  STRENGTH - Ankle Inversion Secure one end of a rubber exercise band/tubing to a fixed object (table, pole). Loop the other end around your foot just before your toes. Place your fists between your knees. This will focus your strengthening at your ankle. Slowly, pull your big toe up and in, making sure the band/tubing is positioned to resist the entire motion. Hold this position for 10 seconds. Have your muscles resist the band/tubing as it slowly pulls your foot back to the starting position. Repeat 3 times. Complete this exercises 2 times per day.  Document Released: 11/18/2005 Document Revised: 02/10/2012 Document Reviewed: 03/02/2009 [facility] Patient Information 2014 [place], [place].

## 2022-08-13 ENCOUNTER — Ambulatory Visit: Payer: Medicare PPO | Admitting: Podiatry

## 2022-08-13 NOTE — Progress Notes (Signed)
Subjective: Chief Complaint  Patient presents with   Thrombophlebitis    Rm 12 2 week follow up. Pt states she is doing well. Still have some discomfort at her arch. Pt states she is very great     66 year old female presents the office for the above complaints.  States that she is doing better.  Symptoms have improved with a superficial thrombophlebitis.  He was also been doing better.  No recent injury or changes otherwise.    Objective: AAO x3, NAD DP/PT pulses palpable bilaterally, CRT less than 3 seconds There is a superficial  thrombophlebitis with localized edema erythema has improved.  There is no pain with calf compression, erythema, warmth. Mild discomfort on palpation on the plantar aspect the calcaneus on the insertion of plantar fascia.  There is no pain with lateral compression of calcaneus.  There is no edema, erythema.  Negative Tinel sign.  Assessment: Superficial thrombophlebitis; Planter fasciitis  Plan: -All treatment options discussed with the patient including all alternatives, risks, complications.  -Compression and discussed symptoms of DVT to monitor for closely. -For the heel pain continue stretching, icing a regular basis as well as wearing shoes and good arch support. -Patient encouraged to call the office with any questions, concerns, change in symptoms.   Vivi Barrack DPM

## 2022-08-15 ENCOUNTER — Ambulatory Visit: Payer: Medicare PPO | Admitting: Podiatry

## 2022-08-27 DIAGNOSIS — U071 COVID-19: Secondary | ICD-10-CM | POA: Diagnosis not present

## 2022-09-27 DIAGNOSIS — R197 Diarrhea, unspecified: Secondary | ICD-10-CM | POA: Diagnosis not present

## 2022-12-12 DIAGNOSIS — E1169 Type 2 diabetes mellitus with other specified complication: Secondary | ICD-10-CM | POA: Diagnosis not present

## 2022-12-12 DIAGNOSIS — E559 Vitamin D deficiency, unspecified: Secondary | ICD-10-CM | POA: Diagnosis not present

## 2022-12-24 DIAGNOSIS — E559 Vitamin D deficiency, unspecified: Secondary | ICD-10-CM | POA: Diagnosis not present

## 2022-12-24 DIAGNOSIS — L299 Pruritus, unspecified: Secondary | ICD-10-CM | POA: Diagnosis not present

## 2022-12-24 DIAGNOSIS — E1169 Type 2 diabetes mellitus with other specified complication: Secondary | ICD-10-CM | POA: Diagnosis not present

## 2022-12-24 DIAGNOSIS — F419 Anxiety disorder, unspecified: Secondary | ICD-10-CM | POA: Diagnosis not present

## 2022-12-24 DIAGNOSIS — Z0000 Encounter for general adult medical examination without abnormal findings: Secondary | ICD-10-CM | POA: Diagnosis not present

## 2022-12-24 DIAGNOSIS — Z6832 Body mass index (BMI) 32.0-32.9, adult: Secondary | ICD-10-CM | POA: Diagnosis not present

## 2023-01-02 DIAGNOSIS — L853 Xerosis cutis: Secondary | ICD-10-CM | POA: Diagnosis not present

## 2023-01-02 DIAGNOSIS — Z1231 Encounter for screening mammogram for malignant neoplasm of breast: Secondary | ICD-10-CM | POA: Diagnosis not present

## 2023-01-02 DIAGNOSIS — Z6833 Body mass index (BMI) 33.0-33.9, adult: Secondary | ICD-10-CM | POA: Diagnosis not present

## 2023-01-02 DIAGNOSIS — Z01419 Encounter for gynecological examination (general) (routine) without abnormal findings: Secondary | ICD-10-CM | POA: Diagnosis not present

## 2023-01-13 DIAGNOSIS — L718 Other rosacea: Secondary | ICD-10-CM | POA: Diagnosis not present

## 2023-01-13 DIAGNOSIS — L2089 Other atopic dermatitis: Secondary | ICD-10-CM | POA: Diagnosis not present

## 2023-02-20 DIAGNOSIS — J069 Acute upper respiratory infection, unspecified: Secondary | ICD-10-CM | POA: Diagnosis not present

## 2023-04-14 DIAGNOSIS — D2272 Melanocytic nevi of left lower limb, including hip: Secondary | ICD-10-CM | POA: Diagnosis not present

## 2023-04-14 DIAGNOSIS — D2262 Melanocytic nevi of left upper limb, including shoulder: Secondary | ICD-10-CM | POA: Diagnosis not present

## 2023-04-14 DIAGNOSIS — D2271 Melanocytic nevi of right lower limb, including hip: Secondary | ICD-10-CM | POA: Diagnosis not present

## 2023-04-14 DIAGNOSIS — D225 Melanocytic nevi of trunk: Secondary | ICD-10-CM | POA: Diagnosis not present

## 2023-04-14 DIAGNOSIS — I8392 Asymptomatic varicose veins of left lower extremity: Secondary | ICD-10-CM | POA: Diagnosis not present

## 2023-04-14 DIAGNOSIS — L2089 Other atopic dermatitis: Secondary | ICD-10-CM | POA: Diagnosis not present

## 2023-04-14 DIAGNOSIS — D2261 Melanocytic nevi of right upper limb, including shoulder: Secondary | ICD-10-CM | POA: Diagnosis not present

## 2023-04-14 DIAGNOSIS — I788 Other diseases of capillaries: Secondary | ICD-10-CM | POA: Diagnosis not present

## 2023-04-14 DIAGNOSIS — I8391 Asymptomatic varicose veins of right lower extremity: Secondary | ICD-10-CM | POA: Diagnosis not present

## 2023-07-28 DIAGNOSIS — U071 COVID-19: Secondary | ICD-10-CM | POA: Diagnosis not present

## 2023-08-05 DIAGNOSIS — J3489 Other specified disorders of nose and nasal sinuses: Secondary | ICD-10-CM | POA: Diagnosis not present

## 2023-08-05 DIAGNOSIS — Z6832 Body mass index (BMI) 32.0-32.9, adult: Secondary | ICD-10-CM | POA: Diagnosis not present

## 2023-08-05 DIAGNOSIS — U071 COVID-19: Secondary | ICD-10-CM | POA: Diagnosis not present

## 2023-09-11 DIAGNOSIS — R197 Diarrhea, unspecified: Secondary | ICD-10-CM | POA: Diagnosis not present

## 2023-09-11 DIAGNOSIS — Z860100 Personal history of colon polyps, unspecified: Secondary | ICD-10-CM | POA: Diagnosis not present

## 2023-11-17 DIAGNOSIS — J019 Acute sinusitis, unspecified: Secondary | ICD-10-CM | POA: Diagnosis not present

## 2023-12-25 DIAGNOSIS — E119 Type 2 diabetes mellitus without complications: Secondary | ICD-10-CM | POA: Diagnosis not present

## 2023-12-25 DIAGNOSIS — E559 Vitamin D deficiency, unspecified: Secondary | ICD-10-CM | POA: Diagnosis not present

## 2023-12-25 DIAGNOSIS — Z13220 Encounter for screening for lipoid disorders: Secondary | ICD-10-CM | POA: Diagnosis not present

## 2023-12-29 DIAGNOSIS — K219 Gastro-esophageal reflux disease without esophagitis: Secondary | ICD-10-CM | POA: Diagnosis not present

## 2023-12-29 DIAGNOSIS — E119 Type 2 diabetes mellitus without complications: Secondary | ICD-10-CM | POA: Diagnosis not present

## 2023-12-29 DIAGNOSIS — E118 Type 2 diabetes mellitus with unspecified complications: Secondary | ICD-10-CM | POA: Diagnosis not present

## 2023-12-29 DIAGNOSIS — Z0000 Encounter for general adult medical examination without abnormal findings: Secondary | ICD-10-CM | POA: Diagnosis not present

## 2023-12-29 DIAGNOSIS — F419 Anxiety disorder, unspecified: Secondary | ICD-10-CM | POA: Diagnosis not present

## 2023-12-29 DIAGNOSIS — Z6833 Body mass index (BMI) 33.0-33.9, adult: Secondary | ICD-10-CM | POA: Diagnosis not present

## 2023-12-29 DIAGNOSIS — E7800 Pure hypercholesterolemia, unspecified: Secondary | ICD-10-CM | POA: Diagnosis not present

## 2023-12-29 DIAGNOSIS — E559 Vitamin D deficiency, unspecified: Secondary | ICD-10-CM | POA: Diagnosis not present

## 2023-12-30 DIAGNOSIS — L400 Psoriasis vulgaris: Secondary | ICD-10-CM | POA: Diagnosis not present

## 2023-12-30 DIAGNOSIS — L718 Other rosacea: Secondary | ICD-10-CM | POA: Diagnosis not present

## 2024-01-09 DIAGNOSIS — Z09 Encounter for follow-up examination after completed treatment for conditions other than malignant neoplasm: Secondary | ICD-10-CM | POA: Diagnosis not present

## 2024-01-09 DIAGNOSIS — K5730 Diverticulosis of large intestine without perforation or abscess without bleeding: Secondary | ICD-10-CM | POA: Diagnosis not present

## 2024-01-09 DIAGNOSIS — Z1211 Encounter for screening for malignant neoplasm of colon: Secondary | ICD-10-CM | POA: Diagnosis not present

## 2024-01-09 DIAGNOSIS — Z860101 Personal history of adenomatous and serrated colon polyps: Secondary | ICD-10-CM | POA: Diagnosis not present

## 2024-01-09 DIAGNOSIS — Z860100 Personal history of colon polyps, unspecified: Secondary | ICD-10-CM | POA: Diagnosis not present

## 2024-01-09 DIAGNOSIS — D122 Benign neoplasm of ascending colon: Secondary | ICD-10-CM | POA: Diagnosis not present

## 2024-01-09 DIAGNOSIS — K635 Polyp of colon: Secondary | ICD-10-CM | POA: Diagnosis not present

## 2024-01-26 DIAGNOSIS — Z6833 Body mass index (BMI) 33.0-33.9, adult: Secondary | ICD-10-CM | POA: Diagnosis not present

## 2024-01-26 DIAGNOSIS — N958 Other specified menopausal and perimenopausal disorders: Secondary | ICD-10-CM | POA: Diagnosis not present

## 2024-01-26 DIAGNOSIS — Z01419 Encounter for gynecological examination (general) (routine) without abnormal findings: Secondary | ICD-10-CM | POA: Diagnosis not present

## 2024-01-26 DIAGNOSIS — Z1231 Encounter for screening mammogram for malignant neoplasm of breast: Secondary | ICD-10-CM | POA: Diagnosis not present

## 2024-02-13 DIAGNOSIS — M7989 Other specified soft tissue disorders: Secondary | ICD-10-CM | POA: Diagnosis not present

## 2024-02-13 DIAGNOSIS — M79662 Pain in left lower leg: Secondary | ICD-10-CM | POA: Diagnosis not present

## 2024-02-13 DIAGNOSIS — I83813 Varicose veins of bilateral lower extremities with pain: Secondary | ICD-10-CM | POA: Diagnosis not present

## 2024-02-13 DIAGNOSIS — I83893 Varicose veins of bilateral lower extremities with other complications: Secondary | ICD-10-CM | POA: Diagnosis not present

## 2024-02-13 DIAGNOSIS — M79661 Pain in right lower leg: Secondary | ICD-10-CM | POA: Diagnosis not present

## 2024-02-13 DIAGNOSIS — M79604 Pain in right leg: Secondary | ICD-10-CM | POA: Diagnosis not present

## 2024-04-20 DIAGNOSIS — D2262 Melanocytic nevi of left upper limb, including shoulder: Secondary | ICD-10-CM | POA: Diagnosis not present

## 2024-04-20 DIAGNOSIS — D2372 Other benign neoplasm of skin of left lower limb, including hip: Secondary | ICD-10-CM | POA: Diagnosis not present

## 2024-04-20 DIAGNOSIS — D2222 Melanocytic nevi of left ear and external auricular canal: Secondary | ICD-10-CM | POA: Diagnosis not present

## 2024-04-20 DIAGNOSIS — L718 Other rosacea: Secondary | ICD-10-CM | POA: Diagnosis not present

## 2024-04-20 DIAGNOSIS — D225 Melanocytic nevi of trunk: Secondary | ICD-10-CM | POA: Diagnosis not present

## 2024-04-20 DIAGNOSIS — L309 Dermatitis, unspecified: Secondary | ICD-10-CM | POA: Diagnosis not present

## 2024-04-29 DIAGNOSIS — E7800 Pure hypercholesterolemia, unspecified: Secondary | ICD-10-CM | POA: Diagnosis not present

## 2024-05-06 DIAGNOSIS — E118 Type 2 diabetes mellitus with unspecified complications: Secondary | ICD-10-CM | POA: Diagnosis not present

## 2024-05-06 DIAGNOSIS — E7800 Pure hypercholesterolemia, unspecified: Secondary | ICD-10-CM | POA: Diagnosis not present

## 2024-05-25 DIAGNOSIS — M7918 Myalgia, other site: Secondary | ICD-10-CM | POA: Diagnosis not present

## 2024-06-22 ENCOUNTER — Ambulatory Visit (HOSPITAL_BASED_OUTPATIENT_CLINIC_OR_DEPARTMENT_OTHER)

## 2024-06-22 ENCOUNTER — Ambulatory Visit (HOSPITAL_BASED_OUTPATIENT_CLINIC_OR_DEPARTMENT_OTHER): Admitting: Physician Assistant

## 2024-06-22 ENCOUNTER — Encounter (HOSPITAL_BASED_OUTPATIENT_CLINIC_OR_DEPARTMENT_OTHER): Payer: Self-pay | Admitting: Physician Assistant

## 2024-06-22 DIAGNOSIS — M25511 Pain in right shoulder: Secondary | ICD-10-CM | POA: Diagnosis not present

## 2024-06-22 DIAGNOSIS — G8929 Other chronic pain: Secondary | ICD-10-CM

## 2024-06-22 MED ORDER — LIDOCAINE HCL 1 % IJ SOLN
5.0000 mL | INTRAMUSCULAR | Status: AC | PRN
  Administered 2024-06-22: 5 mL

## 2024-06-22 MED ORDER — METHYLPREDNISOLONE ACETATE 40 MG/ML IJ SUSP
40.0000 mg | INTRAMUSCULAR | Status: AC | PRN
  Administered 2024-06-22: 40 mg via INTRA_ARTICULAR

## 2024-06-22 NOTE — Progress Notes (Signed)
 Office Visit Note   Patient: Joanna Floyd           Date of Birth: [birth year]           MRN: [id number] Visit Date: 06/22/2024              Requested by: Katina Pfeiffer, PA-C [address] [place],  [place] [address] PCP: Katina Pfeiffer, PA-C   Assessment & Plan: Visit Diagnoses:  1. Chronic right shoulder pain     Plan: Patient is a pleasant 68 year old woman with a chief complaint of a month of right shoulder pain.  She has been exercising and working with a trainer but does not remember a particular issue.  She denies any neck pain the pain is over the top of her shoulder and radiates down into her right arm.  X-rays are fairly benign.  She does have positive impingement findings.  Consistent with rotator cuff impingement.  I talked her about the natural history of this her strength is intact.  We discussed trying a steroid injection today as well as some physical therapy and following up in a month.  She is willing to do  Follow-Up Instructions: Return in about 1 month (around 07/23/2024).   Orders:  Orders Placed This Encounter  Procedures  . DG Shoulder Right   No orders of the defined types were placed in this encounter.     Procedures: Large Joint Inj: R subacromial bursa on 06/22/2024 3:41 PM Indications: diagnostic evaluation and pain Details: 1.5 in posterior approach  Arthrogram: No  Medications: 5 mL lidocaine  1 %; 40 mg methylPREDNISolone  acetate 40 MG/ML Outcome: tolerated well, no immediate complications Procedure, treatment alternatives, risks and benefits explained, specific risks discussed. Consent was given by the patient.      Clinical Data: No additional findings.   Subjective: No chief complaint on file.   HPI this patient is a 68 year old woman with a chief complaint of right shoulder pain.  She has difficulty taking her close on and off.  She thinks this began as she has been working out trying to strengthen her upper body.  Denies  any fever or chills denies any paresthesias Review of Systems  All other systems reviewed and are negative.    Objective: Vital Signs: There were no vitals taken for this visit.  Physical Exam Constitutional:      Appearance: Normal appearance.  Pulmonary:     Effort: Pulmonary effort is normal.  Skin:    General: Skin is warm and dry.  Neurological:     General: No focal deficit present.     Mental Status: She is alert and oriented to person, place, and time.  Psychiatric:        Mood and Affect: Mood normal.        Behavior: Behavior normal.     Ortho Exam Examination of her right shoulder she has pain with forward elevation she has decreased motion with internal rotation behind her back.  She has fair strength with abduction abduction external/internal rotation limited only by pain.  Grip strength is intact she has no paresthesias no pain with movement of her neck.  She has positive empty can test positive impingement signs negative speeds sign Specialty Comments:  No specialty comments available.  Imaging: No results found.   PMFS History: Patient Active Problem List   Diagnosis Date Noted  . Fecal urgency 08/22/2017  . Abdominal bloating 08/21/2017  . Generalized abdominal pain 08/21/2017  . Irritable bowel syndrome  with diarrhea 08/21/2017  . Distal radius fracture 07/29/2012   Past Medical History:  Diagnosis Date  . Varicose veins of both lower extremities     History reviewed. No pertinent family history.  Past Surgical History:  Procedure Laterality Date  . varicose veins Bilateral    Social History   Occupational History  . Not on file  Tobacco Use  . Smoking status: Former    Current packs/day: 0.00    Types: Cigarettes    Quit date: 05/07/1980    Years since quitting: 44.1  . Smokeless tobacco: Never  Substance and Sexual Activity  . Alcohol use: Yes    Alcohol/week: 1.0 standard drink of alcohol    Types: 1 Glasses of wine per week     Comment: monthly  . Drug use: Never  . Sexual activity: Not on file

## 2024-06-29 DIAGNOSIS — E7800 Pure hypercholesterolemia, unspecified: Secondary | ICD-10-CM | POA: Diagnosis not present

## 2024-06-29 DIAGNOSIS — E118 Type 2 diabetes mellitus with unspecified complications: Secondary | ICD-10-CM | POA: Diagnosis not present

## 2024-06-29 DIAGNOSIS — F419 Anxiety disorder, unspecified: Secondary | ICD-10-CM | POA: Diagnosis not present

## 2024-06-29 DIAGNOSIS — Z6832 Body mass index (BMI) 32.0-32.9, adult: Secondary | ICD-10-CM | POA: Diagnosis not present

## 2024-06-29 DIAGNOSIS — I868 Varicose veins of other specified sites: Secondary | ICD-10-CM | POA: Diagnosis not present

## 2024-07-02 ENCOUNTER — Telehealth: Payer: Self-pay | Admitting: Physician Assistant

## 2024-07-02 NOTE — Telephone Encounter (Signed)
Patient called. She would like a referral for PT.

## 2024-07-05 ENCOUNTER — Other Ambulatory Visit (HOSPITAL_BASED_OUTPATIENT_CLINIC_OR_DEPARTMENT_OTHER): Payer: Self-pay

## 2024-07-05 DIAGNOSIS — G8929 Other chronic pain: Secondary | ICD-10-CM

## 2024-07-05 NOTE — Telephone Encounter (Signed)
 Called patient and informed her I put in the referral for PT. Acknowledged and expressed thank you.

## 2024-07-11 NOTE — Therapy (Signed)
 OUTPATIENT PHYSICAL THERAPY SHOULDER EVALUATION   Patient Name: Joanna Floyd MRN: [id number] DOB:30-Oct-1956, 68 y.o., female Today's Date: 07/12/2024  END OF SESSION:  PT End of Session - 07/12/24 1258     Visit Number 1    Number of Visits 12    Date for PT Re-Evaluation 08/23/24    PT Start Time 1203    PT Stop Time 1255    PT Time Calculation (min) 52 min    Activity Tolerance Patient tolerated treatment well    Behavior During Therapy [facility] for tasks assessed/performed          Past Medical History:  Diagnosis Date   Varicose veins of both lower extremities    Past Surgical History:  Procedure Laterality Date   varicose veins Bilateral    Patient Active Problem List   Diagnosis Date Noted   Fecal urgency 08/22/2017   Abdominal bloating 08/21/2017   Generalized abdominal pain 08/21/2017   Irritable bowel syndrome with diarrhea 08/21/2017   Distal radius fracture 07/29/2012    PCP:   REFERRING PROVIDER: Dr. Ronal Dragon Persons  REFERRING DIAG: Chronic Right shoulder pain  THERAPY DIAG:  Chronic right shoulder pain  Rationale for Evaluation and Treatment: Rehabilitation  ONSET DATE: June 2025  SUBJECTIVE:                                                                                                                                                                                      SUBJECTIVE STATEMENT: Pt had been working out with a trainer for exercise but did not notice any pain at the time. Every once in a while she would have some shoulder pain. The last time she saw the trainer and did some overhead activities and pain started after that. Pain started in approximately June of this year. She describes pain at the top of the shoulder that radiates into the right arm mostly to the elbow. She was given a Subacromial injection on 06/22/2024 . After a few days the injection kicked in and pain has improved some but is still moderate. It hurts when she bends her  elbow. Hand dominance: Right  PERTINENT HISTORY:  Pain started in approximately June of this year. She describes pain at the top of the shoulder that radiates into the right arm. She was given a Subacromial injection on 06/22/2024   PAIN:  Are you having pain? Yes: NPRS scale: 0/10 best 4-5/10, worst 5/10 Pain location: Right shoulder Pain description: sharp when using it,achy Aggravating factors: sleeping on right side, carrying purse on right, reaching to cabinet Relieving factors: injection helped some,ibuprofen  with tylenol ,  PRECAUTIONS:  pre DM , prior  thrombophlebitis LE, IBS,  Having veins worked on 07/20/2024  RED FLAGS: None   WEIGHT BEARING RESTRICTIONS: No  FALLS:  Has patient fallen in last 6 months? 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  PLOF: Independent  PATIENT GOALS: Decrease pain, know when she can return to gym  NEXT MD VISIT:   OBJECTIVE:  Note: Objective measures were completed at Evaluation unless otherwise noted.  DIAGNOSTIC FINDINGS:  Pt had shoulder xrays 06/22/2024 with no evidence of arthropathy or fracture/dislocation  PATIENT SURVEYS:  Quick Dash: 31.82%  COGNITION: Overall cognitive status: Within functional limits for tasks assessed     SENSATION: WFL  POSTURE: Forward head rounded shoulders  UPPER EXTREMITY ROM:   Active ROM Right eval Left eval  Shoulder flexion 115 130  Shoulder extension 45 55  Shoulder abduction 109 138  Shoulder adduction    Shoulder internal rotation NT due to compensation 69 stand  Shoulder external rotation NT 100 stand  Elbow flexion WNL   Elbow extension WNL   Wrist flexion    Wrist extension    Wrist ulnar deviation    Wrist radial deviation    Wrist pronation    Wrist supination    (Blank rows = not tested)  UPPER EXTREMITY  MMT:  MMT Right eval Left eval  Shoulder flexion 3+   Shoulder extension    Shoulder abduction 3+   Shoulder adduction    Shoulder internal rotation 4   Shoulder external rotation 4   Middle trapezius    Lower trapezius    Elbow flexion 4+   Elbow extension 4+   Wrist flexion    Wrist extension    Wrist ulnar deviation    Wrist radial deviation    Wrist pronation    Wrist supination    Grip strength (lbs)    (Blank rows = not tested)  SHOULDER SPECIAL TESTS: Positive empty can Positive impingement  JOINT MOBILITY TESTING:  NT  PALPATION:  Tender Right supraspinatus insertion and posterior shoulder, Right distal biceps                                                                                                                             TREATMENT DATE:  07/12/2024 Educated in standing postural exercises; Scapular retraction, shoulder extension, bilateral ER with yellow band x 10 reps 3 days per week. Gave band and written/illustrated instructions. Discussed importance of proper posture to locate shoulder better and to prevent UT compensation. Explained impingement syndrome and importance of not forcing through shoulder pain.   PATIENT EDUCATION: Education details: Educated in standing postural exercises; Scapular retraction, shoulder extension, bilateral ER with yellow band x 10 reps 3 days per week. Gave band and written/illustrated instructions. Discussed importance of proper posture to locate shoulder better and to prevent UT compensation.  Explained impingement syndrome and importance of not forcing through shoulder pain. Person educated: Patient Education method: Explanation, Demonstration, and Handouts Education comprehension: verbalized understanding and returned demonstration  HOME EXERCISE PROGRAM: Scapular retraction, shoulder extension, bilateral ER with yellow band x 10 reps 3 days per week, no pain with exercises  ASSESSMENT:  CLINICAL  IMPRESSION: Patient is a 68 y.o. female who was seen today for physical therapy evaluation and treatment for complaints of right shoulder pain that started in June. She had been working out with a trainer, but did not notice any pain at the time.  At her last visit she did overhead triceps ext with her trainer and developed right shoulder pain thereafter. She is s/p a Subacromial Injection on 06/22/2024 and is feeling some better since then.  She presents with right shoulder pain, and with limitations in AROM, and noted UT compensation with overhead activities. She has a mild + impingement test, and empty can test. She has poor round shouldered, forward head posture.  She is also having some right distal biceps tenderness. Her Quick dash score is 31.82%.She was instructed in the importance of posture for proper location of her shoulder within the joint, and in importance of strengthening to prevent UT compensation. She will benefit from skilled PT to address deficits and return to PLOF  OBJECTIVE IMPAIRMENTS: decreased activity tolerance, decreased knowledge of condition, decreased ROM, decreased strength, impaired UE functional use, postural dysfunction, and pain.   ACTIVITY LIMITATIONS: lifting, sleeping, reach over head, and hygiene/grooming  PARTICIPATION LIMITATIONS: cleaning and activities requiring reaching   REHAB POTENTIAL: Good  CLINICAL DECISION MAKING: Evolving/moderate complexity  EVALUATION COMPLEXITY: Moderate   GOALS: Goals reviewed with patient? Yes  SHORT TERM GOALS: Target date: 08/02/2024  Pt will be independent with a HEP Baseline: Goal status: INITIAL  2.  Pt will have decreased pain by 25% Baseline:  Goal status: INITIAL  3.  Pt will demonstrate improvements with proper posture Baseline:  Goal status: INITIAL  LONG TERM GOALS: Target date: 08/23/2024  Pt will report shoulder pain improved by 50% or greater Baseline:  Goal status: INITIAL  2.  Pts sleep will  be improved 25-50% Baseline:  Goal status: INITIAL  3.  Quick dash will improve to no greater than  15% Baseline:  Goal status: INITIAL  4.  Right shoulder ROM will be improved to within 5 degrees of left shoulder of flexion and abd for improved reaching Baseline:  Goal status: INITIAL  PLAN:  PT FREQUENCY: 2x/week  PT DURATION: 6 weeks  PLANNED INTERVENTIONS: 97164- PT Re-evaluation, 97110-Therapeutic exercises, 97530- Therapeutic activity, 97112- Neuromuscular re-education, 97535- Self Care, 02859- Manual therapy, 97033- Ionotophoresis 4mg /ml Dexamethasone, and Joint mobilization  PLAN FOR NEXT SESSION: Joint mobs, supine AAROM, PROM, review theraband and progress, progress strength and decrease UT compensation   Grayce JINNY Sheldon, PT 07/12/2024, 12:59 PM

## 2024-07-11 NOTE — Therapy (Deleted)
 OUTPATIENT PHYSICAL THERAPY  UPPER EXTREMITY ONCOLOGY EVALUATION  Patient Name: Joanna Floyd MRN: [id number] DOB:11/05/1956, 68 y.o., female Today's Date: 07/11/2024  END OF SESSION:   Past Medical History:  Diagnosis Date   Varicose veins of both lower extremities    Past Surgical History:  Procedure Laterality Date   varicose veins Bilateral    Patient Active Problem List   Diagnosis Date Noted   Fecal urgency 08/22/2017   Abdominal bloating 08/21/2017   Generalized abdominal pain 08/21/2017   Irritable bowel syndrome with diarrhea 08/21/2017   Distal radius fracture 07/29/2012    PCP:   REFERRING PROVIDER: Dr. Ronal Dragon Persons  REFERRING DIAG: Chronic Right shoulder pain  THERAPY DIAG:  No diagnosis found.  ONSET DATE: ***  Rationale for Evaluation and Treatment: Rehabilitation  SUBJECTIVE:                                                                                                                                                                                           SUBJECTIVE STATEMENT: ***  PERTINENT HISTORY:    PAIN:  Are you having pain? {yes/no:20286} NPRS scale: ***/10 Pain location: *** Pain orientation: {Pain Orientation:25161}  PAIN TYPE: {type:313116} Pain description: {PAIN DESCRIPTION:21022940}  Aggravating factors: *** Relieving factors: ***  PRECAUTIONS: {Therapy precautions:24002}  RED FLAGS: {PT Red Flags:29287}   WEIGHT BEARING RESTRICTIONS: {Yes ***/No:24003}  FALLS:  Has patient fallen in last 6 months? {fallsyesno:27318}  LIVING ENVIRONMENT: Lives with: {[facility] lives with:25569::lives with their family} Lives in: {Lives in:[address]} Stairs: {yes/no:20286}; {Stairs:24000} Has following equipment at home: {Assistive devices:23999}  OCCUPATION: ***  LEISURE: ***  HAND DOMINANCE: {RIGHT/LEFT:21944}   PRIOR LEVEL OF FUNCTION: {PLOF:24004}  PATIENT GOALS: ***   OBJECTIVE: Note: Objective measures were completed  at Evaluation unless otherwise noted.  COGNITION: Overall cognitive status: {cognition:24006}   PALPATION: ***  OBSERVATIONS / OTHER ASSESSMENTS: ***  SENSATION: Light touch: {intact/deficits:24005} Stereognosis: {intact/deficits:24005} Hot/Cold: {intact/deficits:24005} Proprioception: {intact/deficits:24005}  POSTURE: ***  UPPER EXTREMITY AROM/PROM:  A/PROM RIGHT   eval   Shoulder extension   Shoulder flexion   Shoulder abduction   Shoulder internal rotation   Shoulder external rotation     (Blank rows = not tested)  A/PROM LEFT   eval  Shoulder extension   Shoulder flexion   Shoulder abduction   Shoulder internal rotation   Shoulder external rotation     (Blank rows = not tested)  CERVICAL AROM: All within normal limits:    Percent limited  Flexion   Extension   Right lateral flexion   Left lateral flexion   Right rotation   Left rotation  UPPER EXTREMITY STRENGTH:   LYMPHEDEMA ASSESSMENTS:   SURGERY TYPE/DATE: ***  NUMBER OF LYMPH NODES REMOVED: ***  CHEMOTHERAPY: ***  RADIATION:***  HORMONE TREATMENT: ***  INFECTIONS: ***   LYMPHEDEMA ASSESSMENTS:   LANDMARK RIGHT  eval  At axilla    15 cm proximal to olecranon process   10 cm proximal to olecranon process   Olecranon process   15 cm proximal to ulnar styloid process   10 cm proximal to ulnar styloid process   Just proximal to ulnar styloid process   Across hand at thumb web space   At base of 2nd digit   (Blank rows = not tested)  LANDMARK LEFT  eval  At axilla    15 cm proximal to olecranon process   10 cm proximal to olecranon process   Olecranon process   15 cm proximal to ulnar styloid process   10 cm proximal to ulnar styloid process   Just proximal to ulnar styloid process   Across hand at thumb web space   At base of 2nd digit   (Blank rows = not tested)   FUNCTIONAL TESTS:  {Functional tests:24029}  GAIT: Distance walked: *** Assistive device  utilized: {Assistive devices:23999} Level of assistance: {Levels of assistance:24026} Comments: ***  L-DEX LYMPHEDEMA SCREENING: The patient was assessed using the L-Dex machine today to produce a lymphedema index baseline score. The patient will be reassessed on a regular basis (typically every 3 months) to obtain new L-Dex scores. If the score is > 6.5 points away from his/her baseline score indicating onset of subclinical lymphedema, it will be recommended to wear a compression garment for 4 weeks, 12 hours per day and then be reassessed. If the score continues to be > 6.5 points from baseline at reassessment, we will initiate lymphedema treatment. Assessing in this manner has a 95% rate of preventing clinically significant lymphedema.  QUICK DASH SURVEY: ***                                                                                                                            TREATMENT DATE: ***    PATIENT EDUCATION:  Education details: *** Person educated: {Person educated:25204} Education method: {Education Method:25205} Education comprehension: {Education Comprehension:25206}  HOME EXERCISE PROGRAM: ***  ASSESSMENT:  CLINICAL IMPRESSION: Patient is a *** y.o. *** who was seen today for physical therapy evaluation and treatment for ***.    OBJECTIVE IMPAIRMENTS: {opptimpairments:25111}.   ACTIVITY LIMITATIONS: {activitylimitations:27494}  PARTICIPATION LIMITATIONS: {participationrestrictions:25113}  PERSONAL FACTORS: {Personal factors:25162} are also affecting patient's functional outcome.   REHAB POTENTIAL: {rehabpotential:25112}  CLINICAL DECISION MAKING: {clinical decision making:25114}  EVALUATION COMPLEXITY: {Evaluation complexity:25115}  GOALS: Goals reviewed with patient? {yes/no:20286}  SHORT TERM GOALS: Target date: ***  *** Baseline: Goal status: INITIAL  2.  *** Baseline:  Goal status: INITIAL  3.  *** Baseline:  Goal status: INITIAL  4.   *** Baseline:  Goal status: INITIAL  5.  *** Baseline:  Goal  status: INITIAL  6.  *** Baseline:  Goal status: INITIAL  LONG TERM GOALS: Target date: ***  *** Baseline:  Goal status: INITIAL  2.  *** Baseline:  Goal status: INITIAL  3.  *** Baseline:  Goal status: INITIAL  4.  *** Baseline:  Goal status: INITIAL  5.  *** Baseline:  Goal status: INITIAL  6.  *** Baseline:  Goal status: INITIAL  PLAN:  PT FREQUENCY: {rehab frequency:25116}  PT DURATION: {rehab duration:25117}  PLANNED INTERVENTIONS: {rehab planned interventions:25118::Patient/Family education,Balance training,Joint mobilization,Therapeutic exercises,Therapeutic activity,Neuromuscular re-education,Gait training,Self Care}  PLAN FOR NEXT SESSION: ***  Grayce JINNY Sheldon, PT 07/11/2024, 12:37 PM

## 2024-07-12 ENCOUNTER — Other Ambulatory Visit: Payer: Self-pay

## 2024-07-12 ENCOUNTER — Ambulatory Visit: Attending: Physician Assistant

## 2024-07-12 DIAGNOSIS — G8929 Other chronic pain: Secondary | ICD-10-CM | POA: Insufficient documentation

## 2024-07-12 DIAGNOSIS — M25611 Stiffness of right shoulder, not elsewhere classified: Secondary | ICD-10-CM | POA: Insufficient documentation

## 2024-07-12 DIAGNOSIS — R293 Abnormal posture: Secondary | ICD-10-CM | POA: Diagnosis not present

## 2024-07-12 DIAGNOSIS — M25511 Pain in right shoulder: Secondary | ICD-10-CM | POA: Insufficient documentation

## 2024-07-12 DIAGNOSIS — M6281 Muscle weakness (generalized): Secondary | ICD-10-CM | POA: Diagnosis not present

## 2024-07-20 DIAGNOSIS — I83891 Varicose veins of right lower extremities with other complications: Secondary | ICD-10-CM | POA: Diagnosis not present

## 2024-07-22 ENCOUNTER — Ambulatory Visit (HOSPITAL_BASED_OUTPATIENT_CLINIC_OR_DEPARTMENT_OTHER): Admitting: Physician Assistant

## 2024-07-22 ENCOUNTER — Ambulatory Visit: Admitting: Physical Therapy

## 2024-07-22 DIAGNOSIS — R293 Abnormal posture: Secondary | ICD-10-CM | POA: Diagnosis not present

## 2024-07-22 DIAGNOSIS — M25611 Stiffness of right shoulder, not elsewhere classified: Secondary | ICD-10-CM

## 2024-07-22 DIAGNOSIS — M6281 Muscle weakness (generalized): Secondary | ICD-10-CM

## 2024-07-22 DIAGNOSIS — G8929 Other chronic pain: Secondary | ICD-10-CM | POA: Diagnosis not present

## 2024-07-22 DIAGNOSIS — M25511 Pain in right shoulder: Secondary | ICD-10-CM | POA: Diagnosis not present

## 2024-07-22 NOTE — Therapy (Signed)
 OUTPATIENT PHYSICAL THERAPY SHOULDER PROGRESS NOTE   Patient Name: Joanna Floyd MRN: [id number] DOB:1956-11-09, 68 y.o., female Today's Date: 07/22/2024  END OF SESSION:  PT End of Session - 07/22/24 1016     Visit Number 2    Number of Visits 12    Date for PT Re-Evaluation 08/23/24    Authorization Type cohere 12 visits 8/11-9/22    PT Start Time 1017    PT Stop Time 1056    PT Time Calculation (min) 39 min    Activity Tolerance Patient tolerated treatment well          Past Medical History:  Diagnosis Date   Varicose veins of both lower extremities    Past Surgical History:  Procedure Laterality Date   varicose veins Bilateral    Patient Active Problem List   Diagnosis Date Noted   Fecal urgency 08/22/2017   Abdominal bloating 08/21/2017   Generalized abdominal pain 08/21/2017   Irritable bowel syndrome with diarrhea 08/21/2017   Distal radius fracture 07/29/2012    PCP:   REFERRING PROVIDER: Dr. Ronal Dragon Persons  REFERRING DIAG: Chronic Right shoulder pain  THERAPY DIAG:  Chronic right shoulder pain  Muscle weakness (generalized)  Stiffness of right shoulder, not elsewhere classified  Abnormal posture  Rationale for Evaluation and Treatment: Rehabilitation  ONSET DATE: June 2025  SUBJECTIVE:                                                                                                                                                                                      SUBJECTIVE STATEMENT: I was on vacation and then had a vein procedure so I haven't really been able to do the ex's yet.     EVAL:Pt had been working out with a trainer for exercise but did not notice any pain at the time. Every once in a while she would have some shoulder pain. The last time she saw the trainer and did some overhead activities and pain started after that. Pain started in approximately June of this year. She describes pain at the top of the shoulder that radiates  into the right arm mostly to the elbow. She was given a Subacromial injection on 06/22/2024 . After a few days the injection kicked in and pain has improved some but is still moderate. It hurts when she bends her elbow. Hand dominance: Right  PERTINENT HISTORY:  Pain started in approximately June of this year. She describes pain at the top of the shoulder that radiates into the right arm. She was given a Subacromial injection on 06/22/2024   PAIN:  Are you having pain? Yes: NPRS scale: 0/10 hurts  with reaching overhead Pain location: Right shoulder Pain description: sharp when using it,achy Aggravating factors: sleeping on right side, carrying purse on right, reaching to cabinet Relieving factors: injection helped some,ibuprofen  with tylenol ,  PRECAUTIONS:  pre DM , prior thrombophlebitis LE, IBS,  Having veins worked on 07/20/2024  RED FLAGS: None   WEIGHT BEARING RESTRICTIONS: No  FALLS:  Has patient fallen in last 6 months? 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  PLOF: Independent  PATIENT GOALS: Decrease pain, know when she can return to gym  NEXT MD VISIT:   OBJECTIVE:  Note: Objective measures were completed at Evaluation unless otherwise noted.  DIAGNOSTIC FINDINGS:  Pt had shoulder xrays 06/22/2024 with no evidence of arthropathy or fracture/dislocation  PATIENT SURVEYS:  Quick Dash: 31.82%  COGNITION: Overall cognitive status: Within functional limits for tasks assessed     SENSATION: WFL  POSTURE: Forward head rounded shoulders  UPPER EXTREMITY ROM:   Active ROM Right eval Left eval  Shoulder flexion 115 130  Shoulder extension 45 55  Shoulder abduction 109 138  Shoulder adduction    Shoulder internal rotation NT due to compensation 69 stand  Shoulder external rotation NT 100 stand  Elbow  flexion WNL   Elbow extension WNL   Wrist flexion    Wrist extension    Wrist ulnar deviation    Wrist radial deviation    Wrist pronation    Wrist supination    (Blank rows = not tested)  UPPER EXTREMITY MMT:  MMT Right eval Left eval  Shoulder flexion 3+   Shoulder extension    Shoulder abduction 3+   Shoulder adduction    Shoulder internal rotation 4   Shoulder external rotation 4   Middle trapezius    Lower trapezius    Elbow flexion 4+   Elbow extension 4+   Wrist flexion    Wrist extension    Wrist ulnar deviation    Wrist radial deviation    Wrist pronation    Wrist supination    Grip strength (lbs)    (Blank rows = not tested)  SHOULDER SPECIAL TESTS: Positive empty can Positive impingement  JOINT MOBILITY TESTING:  NT  PALPATION:  Tender Right supraspinatus insertion and posterior shoulder, Right distal biceps                                                                                                                             TREATMENT DATE:  07/22/24: Discussion of postural implications on reaching Review of initial HEP with yellow band: standing bil external rotation 15x; bil rows (anchored on both doorknobs) 15x, bil shoulder extension (band over the top of the door) 15x Seated thoracic extension with ball (hands behind neck)  12x (Added to HEP- see below) Functional reaching into cabinet: discussed using  her legs to lift heavier items into the fridge or cabinets (push/press method) UE Ranger on wall midway: flexion 10x, scaption 10x Supine clasped hands shoulder flexion 10 sec hold 5x (Added to HEP- see below)   07/12/2024 Educated in standing postural exercises; Scapular retraction, shoulder extension, bilateral ER with yellow band x 10 reps 3 days per week. Gave band and written/illustrated instructions. Discussed importance of proper posture to locate shoulder better and to prevent UT compensation. Explained impingement syndrome and  importance of not forcing through shoulder pain.   PATIENT EDUCATION: Education details: Educated in standing postural exercises; Scapular retraction, shoulder extension, bilateral ER with yellow band x 10 reps 3 days per week. Gave band and written/illustrated instructions. Discussed importance of proper posture to locate shoulder better and to prevent UT compensation. Explained impingement syndrome and importance of not forcing through shoulder pain. Person educated: Patient Education method: Explanation, Demonstration, and Handouts Education comprehension: verbalized understanding and returned demonstration  HOME EXERCISE PROGRAM: Access Code: [email or web address] URL: [email or web address] Date: 07/22/2024 Prepared by: Glade Pesa  Exercises - Seated Thoracic Lumbar Extension with Pectoralis Stretch  - 1 x daily - 7 x weekly - 1 sets - 10 reps - Supine Shoulder Flexion AAROM with Hands Clasped  - 1 x daily - 7 x weekly - 1 sets - 3-5 reps - 10 hold  Exercises - Seated Thoracic Lumbar Extension with Pectoralis Stretch  - 1 x daily - 7 x weekly - 1 sets - 10 reps Scapular retraction, shoulder extension, bilateral ER with yellow band x 10 reps 3 days per week, no pain with exercises  ASSESSMENT:  CLINICAL IMPRESSION: Treatment focus on postural alignment to reduce impingement in the shoulder with elevation needed for reaching during ADLs.  Noted stiffness with thoracic extension which may be a contributing factor.  Therapist providing verbal cues to optimize technique with exercises in order to achieve the greatest benefit.  Therapist also monitoring for pain with low pain irritability level today with active assisted flexion and scaption.     Eval:Patient is a 68 y.o. female who was seen today for physical therapy evaluation and treatment for complaints of right shoulder pain that started in June. She had been working out with a trainer, but did not notice any pain at the time.   At her last visit she did overhead triceps ext with her trainer and developed right shoulder pain thereafter. She is s/p a Subacromial Injection on 06/22/2024 and is feeling some better since then.  She presents with right shoulder pain, and with limitations in AROM, and noted UT compensation with overhead activities. She has a mild + impingement test, and empty can test. She has poor round shouldered, forward head posture.  She is also having some right distal biceps tenderness. Her Quick dash score is 31.82%.She was instructed in the importance of posture for proper location of her shoulder within the joint, and in importance of strengthening to prevent UT compensation. She will benefit from skilled PT to address deficits and return to PLOF  OBJECTIVE IMPAIRMENTS: decreased activity tolerance, decreased knowledge of condition, decreased ROM, decreased strength, impaired UE functional use, postural dysfunction, and pain.   ACTIVITY LIMITATIONS: lifting, sleeping, reach over head, and hygiene/grooming  PARTICIPATION LIMITATIONS: cleaning and activities requiring reaching   REHAB POTENTIAL: Good  CLINICAL DECISION MAKING: Evolving/moderate complexity  EVALUATION COMPLEXITY: Moderate   GOALS: Goals reviewed with patient? Yes  SHORT TERM GOALS: Target date: 08/02/2024  Pt will be independent with a HEP Baseline: Goal  status: INITIAL  2.  Pt will have decreased pain by 25% Baseline:  Goal status: INITIAL  3.  Pt will demonstrate improvements with proper posture Baseline:  Goal status: INITIAL  LONG TERM GOALS: Target date: 08/23/2024  Pt will report shoulder pain improved by 50% or greater Baseline:  Goal status: INITIAL  2.  Pts sleep will be improved 25-50% Baseline:  Goal status: INITIAL  3.  Quick dash will improve to no greater than  15% Baseline:  Goal status: INITIAL  4.  Right shoulder ROM will be improved to within 5 degrees of left shoulder of flexion and abd for  improved reaching Baseline:  Goal status: INITIAL  PLAN:  PT FREQUENCY: 2x/week  PT DURATION: 6 weeks  PLANNED INTERVENTIONS: 97164- PT Re-evaluation, 97110-Therapeutic exercises, 97530- Therapeutic activity, 97112- Neuromuscular re-education, 97535- Self Care, 02859- Manual therapy, 97033- Ionotophoresis 4mg /ml Dexamethasone, and Joint mobilization  PLAN FOR NEXT SESSION: thoracic extension; Joint mobs, supine AAROM, PROM, review theraband and progress, progress strength and decrease UT compensation   Glade Pesa, PT 07/22/24 11:16 AM Phone: [phone] Fax: [phone]

## 2024-07-23 ENCOUNTER — Ambulatory Visit (HOSPITAL_BASED_OUTPATIENT_CLINIC_OR_DEPARTMENT_OTHER): Admitting: Physician Assistant

## 2024-07-26 ENCOUNTER — Ambulatory Visit

## 2024-07-26 DIAGNOSIS — R293 Abnormal posture: Secondary | ICD-10-CM | POA: Diagnosis not present

## 2024-07-26 DIAGNOSIS — M25511 Pain in right shoulder: Secondary | ICD-10-CM | POA: Diagnosis not present

## 2024-07-26 DIAGNOSIS — M6281 Muscle weakness (generalized): Secondary | ICD-10-CM | POA: Diagnosis not present

## 2024-07-26 DIAGNOSIS — M25611 Stiffness of right shoulder, not elsewhere classified: Secondary | ICD-10-CM

## 2024-07-26 DIAGNOSIS — G8929 Other chronic pain: Secondary | ICD-10-CM | POA: Diagnosis not present

## 2024-07-26 NOTE — Patient Instructions (Addendum)
       [facility] [phone]   Side Pull: Double Arm   On back, knees bent, feet flat. Arms perpendicular to body, shoulder level, elbows straight but relaxed. Pull arms out to sides, elbows straight. Resistance band comes across collarbones, hands toward floor. Hold momentarily. Slowly return to starting position. Repeat _5-10__ times. Band color _yellow____

## 2024-07-26 NOTE — Therapy (Signed)
 OUTPATIENT PHYSICAL THERAPY SHOULDER PROGRESS NOTE   Patient Name: Joanna Floyd MRN: [id number] DOB:[birth year], 68 y.o., female Today's Date: 07/26/2024  END OF SESSION:  PT End of Session - 07/26/24 0900     Visit Number 3    Number of Visits 12    Date for PT Re-Evaluation 08/23/24    Authorization Type cohere 12 visits 8/11-9/22    Authorization - Visit Number 3    Authorization - Number of Visits 12    PT Start Time 0901    PT Stop Time 0950    PT Time Calculation (min) 49 min    Activity Tolerance Patient tolerated treatment well    Behavior During Therapy [facility] for tasks assessed/performed          Past Medical History:  Diagnosis Date   Varicose veins of both lower extremities    Past Surgical History:  Procedure Laterality Date   varicose veins Bilateral    Patient Active Problem List   Diagnosis Date Noted   Fecal urgency 08/22/2017   Abdominal bloating 08/21/2017   Generalized abdominal pain 08/21/2017   Irritable bowel syndrome with diarrhea 08/21/2017   Distal radius fracture 07/29/2012    PCP:   REFERRING PROVIDER: Dr. Ronal Dragon Persons  REFERRING DIAG: Chronic Right shoulder pain  THERAPY DIAG:  Chronic right shoulder pain  Muscle weakness (generalized)  Stiffness of right shoulder, not elsewhere classified  Abnormal posture  Rationale for Evaluation and Treatment: Rehabilitation  ONSET DATE: June 2025  SUBJECTIVE:                                                                                                                                                                                      SUBJECTIVE STATEMENT: Did well after last visit. I have been doing HEP and no problems. Trying to be aware of posture. I feel like I am doing better at home with the reaching  and shoulder pain is doing better   EVAL:Pt had been working out with a trainer for exercise but did not notice any pain at the time. Every once in a while she would have some  shoulder pain. The last time she saw the trainer and did some overhead activities and pain started after that. Pain started in approximately June of this year. She describes pain at the top of the shoulder that radiates into the right arm mostly to the elbow. She was given a Subacromial injection on 06/22/2024 . After a few days the injection kicked in and pain has improved some but is still moderate. It hurts when she bends her elbow. Hand dominance: Right  PERTINENT HISTORY:  Pain started  in approximately June of this year. She describes pain at the top of the shoulder that radiates into the right arm. She was given a Subacromial injection on 06/22/2024   PAIN:  Are you having pain? Yes: NPRS scale: 0/10 hurts with reaching overhead Pain location: Right shoulder Pain description: sharp when using it,achy Aggravating factors: sleeping on right side, carrying purse on right, reaching to cabinet Relieving factors: injection helped some,ibuprofen  with tylenol ,  PRECAUTIONS:  pre DM , prior thrombophlebitis LE, IBS,  Having veins worked on 07/20/2024  RED FLAGS: None   WEIGHT BEARING RESTRICTIONS: No  FALLS:  Has patient fallen in last 6 months? 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  PLOF: Independent  PATIENT GOALS: Decrease pain, know when she can return to gym  NEXT MD VISIT:   OBJECTIVE:  Note: Objective measures were completed at Evaluation unless otherwise noted.  DIAGNOSTIC FINDINGS:  Pt had shoulder xrays 06/22/2024 with no evidence of arthropathy or fracture/dislocation  PATIENT SURVEYS:  Quick Dash: 31.82%  COGNITION: Overall cognitive status: Within functional limits for tasks assessed     SENSATION: WFL  POSTURE: Forward head rounded shoulders  UPPER EXTREMITY ROM:   Active ROM Right eval  Left eval  Shoulder flexion 115 130  Shoulder extension 45 55  Shoulder abduction 109 138  Shoulder adduction    Shoulder internal rotation NT due to compensation 69 stand  Shoulder external rotation NT 100 stand  Elbow flexion WNL   Elbow extension WNL   Wrist flexion    Wrist extension    Wrist ulnar deviation    Wrist radial deviation    Wrist pronation    Wrist supination    (Blank rows = not tested)  UPPER EXTREMITY MMT:  MMT Right eval Left eval  Shoulder flexion 3+   Shoulder extension    Shoulder abduction 3+   Shoulder adduction    Shoulder internal rotation 4   Shoulder external rotation 4   Middle trapezius    Lower trapezius    Elbow flexion 4+   Elbow extension 4+   Wrist flexion    Wrist extension    Wrist ulnar deviation    Wrist radial deviation    Wrist pronation    Wrist supination    Grip strength (lbs)    (Blank rows = not tested)  SHOULDER SPECIAL TESTS: Positive empty can Positive impingement  JOINT MOBILITY TESTING:  NT  PALPATION:  Tender Right supraspinatus insertion and posterior shoulder, Right distal biceps                                                                                                                             TREATMENT DATE:  07/26/2024 Reviewed HEP given last time. Pt had done thoracic extension incorrectly and had combined with  clasped hands flexion Sitting in chair with towel roll; Thoracic extension x 15 Reviewed HEP and progressed to with red band: standing bil external rotation 15x yellow; bil row 15x red, bil shoulder extension red 15x. Band on parallel bars UE Ranger on wall midway: flexion 10x, scaption 10x Supine clasped hands shoulder flexion 10 sec hold 5x,, Wand x 5 reps Supine horizontal abd yellow x 15 Serratus punch 2 # 2 x 10 with PT guidance for form with first 10 Alternating isometrics IR and ER with PT resistance Shoulder flexion 125; no pain 07/22/24: Discussion of postural implications  on reaching Review of initial HEP with yellow band: standing bil external rotation 15x; bil rows (anchored on both doorknobs) 15x, bil shoulder extension (band over the top of the door) 15x Seated thoracic extension with ball (hands behind neck)  12x (Added to HEP- see below) Functional reaching into cabinet: discussed using her legs to lift heavier items into the fridge or cabinets (push/press method) UE Ranger on wall midway: flexion 10x, scaption 10x Supine clasped hands shoulder flexion 10 sec hold 5x (Added to HEP- see below)   07/12/2024 Educated in standing postural exercises; Scapular retraction, shoulder extension, bilateral ER with yellow band x 10 reps 3 days per week. Gave band and written/illustrated instructions. Discussed importance of proper posture to locate shoulder better and to prevent UT compensation. Explained impingement syndrome and importance of not forcing through shoulder pain.   PATIENT EDUCATION: Education details: Educated in standing postural exercises; Scapular retraction, shoulder extension, bilateral ER with yellow band x 10 reps 3 days per week. Gave band and written/illustrated instructions. Discussed importance of proper posture to locate shoulder better and to prevent UT compensation. Explained impingement syndrome and importance of not forcing through shoulder pain. Person educated: Patient Education method: Explanation, Demonstration, and Handouts Education comprehension: verbalized understanding and returned demonstration  HOME EXERCISE PROGRAM: Access Code: [email or web address] URL: [email or web address] Date: 07/22/2024 Prepared by: Glade Pesa  Exercises - Seated Thoracic Lumbar Extension with Pectoralis Stretch  - 1 x daily - 7 x weekly - 1 sets - 10 reps - Supine Shoulder Flexion AAROM with Hands Clasped  - 1 x daily - 7 x weekly - 1 sets - 3-5 reps - 10 hold  Exercises - Seated Thoracic Lumbar Extension with Pectoralis Stretch  - 1 x  daily - 7 x weekly - 1 sets - 10 reps Scapular retraction, shoulder extension, bilateral ER with yellow band x 10 reps 3 days per week, no pain with exercises  ASSESSMENT:  CLINICAL IMPRESSION:  Continued to focus on importance of proper posture and scapular depression. Pt was able to progress to the red band for Scap. Retraction and shoulder extension. Horizontal abd with yellow band was added to HEP. Pt had good improvement in AROM for shoulder flexion today without increase in pain.    Eval:Patient is a 68 y.o. female who was seen today for physical therapy evaluation and treatment for complaints of right shoulder pain that started in June. She had been working out with a trainer, but did not notice any pain at the time.  At her last visit she did overhead triceps ext with her trainer and developed right shoulder pain thereafter. She is s/p a Subacromial Injection on 06/22/2024 and is feeling some better since then.  She presents with right shoulder pain, and with limitations in AROM, and noted UT compensation with overhead activities. She has a mild + impingement test, and empty can test. She has poor round shouldered,  forward head posture.  She is also having some right distal biceps tenderness. Her Quick dash score is 31.82%.She was instructed in the importance of posture for proper location of her shoulder within the joint, and in importance of strengthening to prevent UT compensation. She will benefit from skilled PT to address deficits and return to PLOF  OBJECTIVE IMPAIRMENTS: decreased activity tolerance, decreased knowledge of condition, decreased ROM, decreased strength, impaired UE functional use, postural dysfunction, and pain.   ACTIVITY LIMITATIONS: lifting, sleeping, reach over head, and hygiene/grooming  PARTICIPATION LIMITATIONS: cleaning and activities requiring reaching   REHAB POTENTIAL: Good  CLINICAL DECISION MAKING: Evolving/moderate complexity  EVALUATION COMPLEXITY:  Moderate   GOALS: Goals reviewed with patient? Yes  SHORT TERM GOALS: Target date: 08/02/2024  Pt will be independent with a HEP Baseline: Goal status: INITIAL  2.  Pt will have decreased pain by 25% Baseline:  Goal status: INITIAL  3.  Pt will demonstrate improvements with proper posture Baseline:  Goal status: INITIAL  LONG TERM GOALS: Target date: 08/23/2024  Pt will report shoulder pain improved by 50% or greater Baseline:  Goal status: INITIAL  2.  Pts sleep will be improved 25-50% Baseline:  Goal status: INITIAL  3.  Quick dash will improve to no greater than  15% Baseline:  Goal status: INITIAL  4.  Right shoulder ROM will be improved to within 5 degrees of left shoulder of flexion and abd for improved reaching Baseline:  Goal status: INITIAL  PLAN:  PT FREQUENCY: 2x/week  PT DURATION: 6 weeks  PLANNED INTERVENTIONS: 97164- PT Re-evaluation, 97110-Therapeutic exercises, 97530- Therapeutic activity, 97112- Neuromuscular re-education, 97535- Self Care, 02859- Manual therapy, 97033- Ionotophoresis 4mg /ml Dexamethasone, and Joint mobilization  PLAN FOR NEXT SESSION: thoracic extension; Joint mobs, supine AAROM, PROM, review theraband and progress, progress strength and decrease UT compensation   Glade Pesa, PT 07/26/24 9:54 AM Phone: [phone] Fax: [phone]

## 2024-07-27 DIAGNOSIS — I83891 Varicose veins of right lower extremities with other complications: Secondary | ICD-10-CM | POA: Diagnosis not present

## 2024-07-27 DIAGNOSIS — I87391 Chronic venous hypertension (idiopathic) with other complications of right lower extremity: Secondary | ICD-10-CM | POA: Diagnosis not present

## 2024-07-29 ENCOUNTER — Encounter

## 2024-07-30 ENCOUNTER — Ambulatory Visit

## 2024-07-30 DIAGNOSIS — G8929 Other chronic pain: Secondary | ICD-10-CM

## 2024-07-30 DIAGNOSIS — R293 Abnormal posture: Secondary | ICD-10-CM | POA: Diagnosis not present

## 2024-07-30 DIAGNOSIS — M25611 Stiffness of right shoulder, not elsewhere classified: Secondary | ICD-10-CM | POA: Diagnosis not present

## 2024-07-30 DIAGNOSIS — M6281 Muscle weakness (generalized): Secondary | ICD-10-CM

## 2024-07-30 DIAGNOSIS — M25511 Pain in right shoulder: Secondary | ICD-10-CM | POA: Diagnosis not present

## 2024-07-30 NOTE — Therapy (Signed)
 OUTPATIENT PHYSICAL THERAPY SHOULDER PROGRESS NOTE   Patient Name: Joanna Floyd MRN: [id number] DOB:[birth year], 68 y.o., female Today's Date: 07/30/2024  END OF SESSION:  PT End of Session - 07/30/24 1011     Visit Number 4    Number of Visits 12    Date for PT Re-Evaluation 08/23/24    Authorization Type cohere 12 visits 8/11-9/22    Authorization - Visit Number 4    Authorization - Number of Visits 12    PT Start Time 1011   late   PT Stop Time 1104    PT Time Calculation (min) 53 min    Activity Tolerance Patient tolerated treatment well    Behavior During Therapy [facility] for tasks assessed/performed          Past Medical History:  Diagnosis Date   Varicose veins of both lower extremities    Past Surgical History:  Procedure Laterality Date   varicose veins Bilateral    Patient Active Problem List   Diagnosis Date Noted   Fecal urgency 08/22/2017   Abdominal bloating 08/21/2017   Generalized abdominal pain 08/21/2017   Irritable bowel syndrome with diarrhea 08/21/2017   Distal radius fracture 07/29/2012    PCP:   REFERRING PROVIDER: Dr. Ronal Dragon Persons  REFERRING DIAG: Chronic Right shoulder pain  THERAPY DIAG:  Chronic right shoulder pain  Muscle weakness (generalized)  Stiffness of right shoulder, not elsewhere classified  Abnormal posture  Rationale for Evaluation and Treatment: Rehabilitation  ONSET DATE: June 2025  SUBJECTIVE:                                                                                                                                                                                      SUBJECTIVE STATEMENT: I have only done my exercises 1 x. We were getting ready for the [place] festival and I over did it. Wednesday I had a lot of people over and cleaning lady to help, but I went behind her cleaning all the stainless steel appliances. After she vacuumed I went behind her the [place] mop. Its not terrible pain, but I feel  it.  EVAL:Pt had been working out with a trainer for exercise but did not notice any pain at the time. Every once in a while she would have some shoulder pain. The last time she saw the trainer and did some overhead activities and pain started after that. Pain started in approximately June of this year. She describes pain at the top of the shoulder that radiates into the right arm mostly to the elbow. She was given a Subacromial injection on 06/22/2024 . After a few days the injection kicked  in and pain has improved some but is still moderate. It hurts when she bends her elbow. Hand dominance: Right  PERTINENT HISTORY:  Pain started in approximately June of this year. She describes pain at the top of the shoulder that radiates into the right arm. She was given a Subacromial injection on 06/22/2024   PAIN:  Are you having pain? Yes: NPRS scale: 0/10 at rest, 5/10 with movement Pain location: Right shoulder Pain description: sharp when using it,achy Aggravating factors: sleeping on right side, carrying purse on right, reaching to cabinet Relieving factors: injection helped some,ibuprofen  with tylenol ,  PRECAUTIONS:  pre DM , prior thrombophlebitis LE, IBS,  Having veins worked on 07/20/2024  RED FLAGS: None   WEIGHT BEARING RESTRICTIONS: No  FALLS:  Has patient fallen in last 6 months? 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  PLOF: Independent  PATIENT GOALS: Decrease pain, know when she can return to gym  NEXT MD VISIT:   OBJECTIVE:  Note: Objective measures were completed at Evaluation unless otherwise noted.  DIAGNOSTIC FINDINGS:  Pt had shoulder xrays 06/22/2024 with no evidence of arthropathy or fracture/dislocation  PATIENT SURVEYS:  Quick Dash: 31.82%  COGNITION: Overall cognitive status: Within functional  limits for tasks assessed     SENSATION: WFL  POSTURE: Forward head rounded shoulders  UPPER EXTREMITY ROM:   Active ROM Right eval Left eval  Shoulder flexion 115 130  Shoulder extension 45 55  Shoulder abduction 109 138  Shoulder adduction    Shoulder internal rotation NT due to compensation 69 stand  Shoulder external rotation NT 100 stand  Elbow flexion WNL   Elbow extension WNL   Wrist flexion    Wrist extension    Wrist ulnar deviation    Wrist radial deviation    Wrist pronation    Wrist supination    (Blank rows = not tested)  UPPER EXTREMITY MMT:  MMT Right eval Left eval  Shoulder flexion 3+   Shoulder extension    Shoulder abduction 3+   Shoulder adduction    Shoulder internal rotation 4   Shoulder external rotation 4   Middle trapezius    Lower trapezius    Elbow flexion 4+   Elbow extension 4+   Wrist flexion    Wrist extension    Wrist ulnar deviation    Wrist radial deviation    Wrist pronation    Wrist supination    Grip strength (lbs)    (Blank rows = not tested)  SHOULDER SPECIAL TESTS: Positive empty can Positive impingement  JOINT MOBILITY TESTING:  NT  PALPATION:  Tender Right supraspinatus insertion and posterior shoulder, Right distal biceps  TREATMENT DATE:   07/30/2024 Discussed pt progress Discussed use of ice to shoulder x 10 min with gel pack that is padded with towel for pain control when needed Dual Cable machine scapular retraction and shoulder extension 2 x 10 ea, VC prn for proper posture Bilateral ER yellow band x 10, Tried cable initially with 3 # but too heavy and poor form so switched to band Supine horizontal abd yellow 2 x 10 Supine alphabet A- Z 1# x 1 Serratus punch 2 # x 15 Supine chest press 2 # 2 x 10 Alternating isometrics ER/IR 2 x 20 sec Rhythmic stab exs 90 degrees flex    07/26/2024 Reviewed HEP given last time. Pt had done thoracic extension incorrectly and had combined with clasped hands flexion Sitting in chair with towel roll; Thoracic extension x 15 Reviewed HEP and progressed to with red band: standing bil external rotation 15x yellow; bil row 15x red, bil shoulder extension red 15x. Band on parallel bars UE Ranger on wall midway: flexion 10x, scaption 10x Supine clasped hands shoulder flexion 10 sec hold 5x,, Wand x 5 reps Supine horizontal abd yellow x 15 Serratus punch 2 # 2 x 10 with PT guidance for form with first 10 Alternating isometrics IR and ER with PT resistance Shoulder flexion 125; no pain 07/22/24: Discussion of postural implications on reaching Review of initial HEP with yellow band: standing bil external rotation 15x; bil rows (anchored on both doorknobs) 15x, bil shoulder extension (band over the top of the door) 15x Seated thoracic extension with ball (hands behind neck)  12x (Added to HEP- see below) Functional reaching into cabinet: discussed using her legs to lift heavier items into the fridge or cabinets (push/press method) UE Ranger on wall midway: flexion 10x, scaption 10x Supine clasped hands shoulder flexion 10 sec hold 5x (Added to HEP- see below)   07/12/2024 Educated in standing postural exercises; Scapular retraction, shoulder extension, bilateral ER with yellow band x 10 reps 3 days per week. Gave band and written/illustrated instructions. Discussed importance of proper posture to locate shoulder better and to prevent UT compensation. Explained impingement syndrome and importance of not forcing through shoulder pain.   PATIENT EDUCATION: Education details: Educated in standing postural exercises; Scapular retraction, shoulder extension, bilateral ER with yellow band x 10 reps 3 days per week. Gave band and written/illustrated instructions. Discussed importance of proper posture to locate shoulder better and to prevent UT  compensation. Explained impingement syndrome and importance of not forcing through shoulder pain. Person educated: Patient Education method: Explanation, Demonstration, and Handouts Education comprehension: verbalized understanding and returned demonstration  HOME EXERCISE PROGRAM: Access Code: [email or web address] URL: [email or web address] Date: 07/22/2024 Prepared by: Glade Pesa  Exercises - Seated Thoracic Lumbar Extension with Pectoralis Stretch  - 1 x daily - 7 x weekly - 1 sets - 10 reps - Supine Shoulder Flexion AAROM with Hands Clasped  - 1 x daily - 7 x weekly - 1 sets - 3-5 reps - 10 hold  Exercises - Seated Thoracic Lumbar Extension with Pectoralis Stretch  - 1 x daily - 7 x weekly - 1 sets - 10 reps Scapular retraction, shoulder extension, bilateral ER with yellow band x 10 reps 3 days per week, no pain with exercises  ASSESSMENT:  CLINICAL IMPRESSION:  Continued to focus on importance of proper posture and scapular depression. Pt was able to progress to dual cable machine and add selected exercises today without increased pain. Pt had no complaints of pain after treatment.  Eval:Patient is a 68 y.o. female who was seen today for physical therapy evaluation and treatment for complaints of right shoulder pain that started in June. She had been working out with a trainer, but did not notice any pain at the time.  At her last visit she did overhead triceps ext with her trainer and developed right shoulder pain thereafter. She is s/p a Subacromial Injection on 06/22/2024 and is feeling some better since then.  She presents with right shoulder pain, and with limitations in AROM, and noted UT compensation with overhead activities. She has a mild + impingement test, and empty can test. She has poor round shouldered, forward head posture.  She is also having some right distal biceps tenderness. Her Quick dash score is 31.82%.She was instructed in the importance of posture for  proper location of her shoulder within the joint, and in importance of strengthening to prevent UT compensation. She will benefit from skilled PT to address deficits and return to PLOF  OBJECTIVE IMPAIRMENTS: decreased activity tolerance, decreased knowledge of condition, decreased ROM, decreased strength, impaired UE functional use, postural dysfunction, and pain.   ACTIVITY LIMITATIONS: lifting, sleeping, reach over head, and hygiene/grooming  PARTICIPATION LIMITATIONS: cleaning and activities requiring reaching   REHAB POTENTIAL: Good  CLINICAL DECISION MAKING: Evolving/moderate complexity  EVALUATION COMPLEXITY: Moderate   GOALS: Goals reviewed with patient? Yes  SHORT TERM GOALS: Target date: 08/02/2024  Pt will be independent with a HEP Baseline: Goal status: INITIAL  2.  Pt will have decreased pain by 25% Baseline:  Goal status: INITIAL  3.  Pt will demonstrate improvements with proper posture Baseline:  Goal status: INITIAL  LONG TERM GOALS: Target date: 08/23/2024  Pt will report shoulder pain improved by 50% or greater Baseline:  Goal status: INITIAL  2.  Pts sleep will be improved 25-50% Baseline:  Goal status: INITIAL  3.  Quick dash will improve to no greater than  15% Baseline:  Goal status: INITIAL  4.  Right shoulder ROM will be improved to within 5 degrees of left shoulder of flexion and abd for improved reaching Baseline:  Goal status: INITIAL  PLAN:  PT FREQUENCY: 2x/week  PT DURATION: 6 weeks  PLANNED INTERVENTIONS: 97164- PT Re-evaluation, 97110-Therapeutic exercises, 97530- Therapeutic activity, 97112- Neuromuscular re-education, 97535- Self Care, 02859- Manual therapy, 97033- Ionotophoresis 4mg /ml Dexamethasone, and Joint mobilization  PLAN FOR NEXT SESSION: thoracic extension; Joint mobs, supine AAROM, PROM, review theraband and progress, progress strength and decrease UT compensation   Glade Pesa, PT 07/30/24 12:44 PM Phone:  [phone] Fax: [phone]

## 2024-08-03 ENCOUNTER — Ambulatory Visit: Admitting: Rehabilitative and Restorative Service Providers"

## 2024-08-03 DIAGNOSIS — M7989 Other specified soft tissue disorders: Secondary | ICD-10-CM | POA: Diagnosis not present

## 2024-08-03 DIAGNOSIS — I83812 Varicose veins of left lower extremities with pain: Secondary | ICD-10-CM | POA: Diagnosis not present

## 2024-08-03 DIAGNOSIS — I83892 Varicose veins of left lower extremities with other complications: Secondary | ICD-10-CM | POA: Diagnosis not present

## 2024-08-06 ENCOUNTER — Ambulatory Visit: Attending: Physician Assistant | Admitting: Rehabilitative and Restorative Service Providers"

## 2024-08-06 ENCOUNTER — Encounter: Payer: Self-pay | Admitting: Rehabilitative and Restorative Service Providers"

## 2024-08-06 DIAGNOSIS — M6281 Muscle weakness (generalized): Secondary | ICD-10-CM | POA: Diagnosis not present

## 2024-08-06 DIAGNOSIS — G8929 Other chronic pain: Secondary | ICD-10-CM | POA: Insufficient documentation

## 2024-08-06 DIAGNOSIS — M25511 Pain in right shoulder: Secondary | ICD-10-CM | POA: Insufficient documentation

## 2024-08-06 DIAGNOSIS — R293 Abnormal posture: Secondary | ICD-10-CM | POA: Diagnosis not present

## 2024-08-06 DIAGNOSIS — M25611 Stiffness of right shoulder, not elsewhere classified: Secondary | ICD-10-CM | POA: Insufficient documentation

## 2024-08-06 NOTE — Therapy (Signed)
 OUTPATIENT PHYSICAL THERAPY SHOULDER PROGRESS NOTE   Patient Name: Joanna Floyd MRN: [id number] DOB:29-Jul-1956, 68 y.o., female Today's Date: 08/06/2024  END OF SESSION:  PT End of Session - 08/06/24 1106     Visit Number 5    Date for PT Re-Evaluation 08/23/24    Authorization Type cohere 12 visits 8/11-9/22    Authorization - Visit Number 5    Authorization - Number of Visits 12    PT Start Time 1103    PT Stop Time 1141    PT Time Calculation (min) 38 min    Activity Tolerance Patient tolerated treatment well    Behavior During Therapy [facility] for tasks assessed/performed          Past Medical History:  Diagnosis Date   Varicose veins of both lower extremities    Past Surgical History:  Procedure Laterality Date   varicose veins Bilateral    Patient Active Problem List   Diagnosis Date Noted   Fecal urgency 08/22/2017   Abdominal bloating 08/21/2017   Generalized abdominal pain 08/21/2017   Irritable bowel syndrome with diarrhea 08/21/2017   Distal radius fracture 07/29/2012    PCP:   REFERRING PROVIDER: Dr. Ronal Dragon Persons  REFERRING DIAG: Chronic Right shoulder pain  THERAPY DIAG:  Chronic right shoulder pain  Muscle weakness (generalized)  Stiffness of right shoulder, not elsewhere classified  Abnormal posture  Rationale for Evaluation and Treatment: Rehabilitation  ONSET DATE: June 2025  SUBJECTIVE:                                                                                                                                                                                      SUBJECTIVE STATEMENT: Pt reports no new complaints, states minor pain in her right shoulder.  EVAL:Pt had been working out with a trainer for exercise but did not notice any pain at the time. Every once in a while she would have some shoulder pain. The last time she saw the trainer and did some overhead activities and pain started after that. Pain started in approximately  June of this year. She describes pain at the top of the shoulder that radiates into the right arm mostly to the elbow. She was given a Subacromial injection on 06/22/2024 . After a few days the injection kicked in and pain has improved some but is still moderate. It hurts when she bends her elbow. Hand dominance: Right  PERTINENT HISTORY:  Pain started in approximately June of this year. She describes pain at the top of the shoulder that radiates into the right arm. She was given a Subacromial injection on 06/22/2024   PAIN:  Are  you having pain? Yes: NPRS scale: 3/10 Pain location: Right shoulder Pain description: sharp when using it,achy Aggravating factors: sleeping on right side, carrying purse on right, reaching to cabinet Relieving factors: injection helped some,ibuprofen  with tylenol ,  PRECAUTIONS:  pre DM , prior thrombophlebitis LE, IBS,  Having veins worked on 07/20/2024  RED FLAGS: None   WEIGHT BEARING RESTRICTIONS: No  FALLS:  Has patient fallen in last 6 months? 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  PLOF: Independent  PATIENT GOALS: Decrease pain, know when she can return to gym  NEXT MD VISIT: 08/10/2024  OBJECTIVE:  Note: Objective measures were completed at Evaluation unless otherwise noted.  DIAGNOSTIC FINDINGS:  Pt had shoulder xrays 06/22/2024 with no evidence of arthropathy or fracture/dislocation  PATIENT SURVEYS:  Quick Dash: 31.82%  COGNITION: Overall cognitive status: Within functional limits for tasks assessed     SENSATION: WFL  POSTURE: Forward head rounded shoulders  UPPER EXTREMITY ROM:   Active ROM Right eval Left eval  Shoulder flexion 115 130  Shoulder extension 45 55  Shoulder abduction 109 138  Shoulder adduction    Shoulder internal rotation NT due to compensation  69 stand  Shoulder external rotation NT 100 stand  Elbow flexion WNL   Elbow extension WNL   Wrist flexion    Wrist extension    Wrist ulnar deviation    Wrist radial deviation    Wrist pronation    Wrist supination    (Blank rows = not tested)  UPPER EXTREMITY MMT:  MMT Right eval Left eval  Shoulder flexion 3+   Shoulder extension    Shoulder abduction 3+   Shoulder adduction    Shoulder internal rotation 4   Shoulder external rotation 4   Middle trapezius    Lower trapezius    Elbow flexion 4+   Elbow extension 4+   Wrist flexion    Wrist extension    Wrist ulnar deviation    Wrist radial deviation    Wrist pronation    Wrist supination    Grip strength (lbs)    (Blank rows = not tested)  SHOULDER SPECIAL TESTS: Positive empty can Positive impingement  JOINT MOBILITY TESTING:  NT  PALPATION:  Tender Right supraspinatus insertion and posterior shoulder, Right distal biceps                                                                                                                             TREATMENT DATE:   08/06/2024: Shoulder pulleys for flexion and abduction x2 min each Supine serratus punch with 2# dumbbell 2x10 bilat Supine shoulder flexion with 3# weight on dowel rod 2x10 Supine chest press with 3# weight on dowel rod 2x10 Supine shoulder horizontal abduction with red tband 2x10 Supine shoulder ER with red tband 2x10  Supine alphabet A- Z 2# x 1 bilat Standing rows with red tband 2x10 Standing shoulder extension with red tband 2x10 Standing scapular stabilization with red plyoball on wall performing CW and CCW circles x20 each bilat Standing barre push-ups 2x10   07/30/2024 Discussed pt progress Discussed use of ice to shoulder x 10 min with gel pack that is padded with towel for pain control when needed Dual Cable machine scapular retraction and shoulder extension 2 x 10 ea, VC prn for proper posture Bilateral ER yellow band x 10, Tried  cable initially with 3 # but too heavy and poor form so switched to band Supine horizontal abd yellow 2 x 10 Supine alphabet A- Z 1# x 1 Serratus punch 2 # x 15 Supine chest press 2 # 2 x 10 Alternating isometrics ER/IR 2 x 20 sec Rhythmic stab exs 90 degrees flex   07/26/2024 Reviewed HEP given last time. Pt had done thoracic extension incorrectly and had combined with clasped hands flexion Sitting in chair with towel roll; Thoracic extension x 15 Reviewed HEP and progressed to with red band: standing bil external rotation 15x yellow; bil row 15x red, bil shoulder extension red 15x. Band on parallel bars UE Ranger on wall midway: flexion 10x, scaption 10x Supine clasped hands shoulder flexion 10 sec hold 5x,, Wand x 5 reps Supine horizontal abd yellow x 15 Serratus punch 2 # 2 x 10 with PT guidance for form with first 10 Alternating isometrics IR and ER with PT resistance Shoulder flexion 125; no pain   PATIENT EDUCATION: Education details: Educated in standing postural exercises; Scapular retraction, shoulder extension, bilateral ER with yellow band x 10 reps 3 days per week. Gave band and written/illustrated instructions. Discussed importance of proper posture to locate shoulder better and to prevent UT compensation. Explained impingement syndrome and importance of not forcing through shoulder pain. Person educated: Patient Education method: Explanation, Demonstration, and Handouts Education comprehension: verbalized understanding and returned demonstration  HOME EXERCISE PROGRAM: Access Code: [email or web address] URL: [email or web address] Date: 07/22/2024 Prepared by: Glade Pesa  Exercises - Seated Thoracic Lumbar Extension with Pectoralis Stretch  - 1 x daily - 7 x weekly - 1 sets - 10 reps - Supine Shoulder Flexion AAROM with Hands Clasped  - 1 x daily - 7 x weekly - 1 sets - 3-5 reps - 10 hold  Exercises - Seated Thoracic Lumbar Extension with Pectoralis Stretch  - 1  x daily - 7 x weekly - 1 sets - 10 reps Scapular retraction, shoulder extension, bilateral ER with yellow band x 10 reps 3 days per week, no pain with exercises  ASSESSMENT:  CLINICAL IMPRESSION:  Ms Strome presents to skilled PT reporting that overall, her shoulder is feeling better.  She states that she has been doing her exercises.  Patient reports that she had some pain in her leg after her last varicose vein treatment, but she is starting to feel better.  Patient was able to increase the resistance of the theraband today to utilize the red band today.  Patient requires occasional cuing to avoid shrugging her shoulders during exercises.  Patient continues to require skilled PT to progress towards goal related activities.    Eval:Patient is a 69 y.o. female who was seen today for physical therapy evaluation and treatment for complaints of right shoulder pain that started in June. She had been working out with a trainer, but did not notice any pain at the time.  At her last visit she did overhead  triceps ext with her trainer and developed right shoulder pain thereafter. She is s/p a Subacromial Injection on 06/22/2024 and is feeling some better since then.  She presents with right shoulder pain, and with limitations in AROM, and noted UT compensation with overhead activities. She has a mild + impingement test, and empty can test. She has poor round shouldered, forward head posture.  She is also having some right distal biceps tenderness. Her Quick dash score is 31.82%.She was instructed in the importance of posture for proper location of her shoulder within the joint, and in importance of strengthening to prevent UT compensation. She will benefit from skilled PT to address deficits and return to PLOF  OBJECTIVE IMPAIRMENTS: decreased activity tolerance, decreased knowledge of condition, decreased ROM, decreased strength, impaired UE functional use, postural dysfunction, and pain.   ACTIVITY LIMITATIONS:  lifting, sleeping, reach over head, and hygiene/grooming  PARTICIPATION LIMITATIONS: cleaning and activities requiring reaching   REHAB POTENTIAL: Good  CLINICAL DECISION MAKING: Evolving/moderate complexity  EVALUATION COMPLEXITY: Moderate   GOALS: Goals reviewed with patient? Yes  SHORT TERM GOALS: Target date: 08/02/2024  Pt will be independent with a HEP Baseline: Goal status: Met on 08/06/24  2.  Pt will have decreased pain by 25% Baseline:  Goal status: Ongoing  3.  Pt will demonstrate improvements with proper posture Baseline:  Goal status: Ongoing  LONG TERM GOALS: Target date: 08/23/2024  Pt will report shoulder pain improved by 50% or greater Baseline:  Goal status: INITIAL  2.  Pts sleep will be improved 25-50% Baseline:  Goal status: INITIAL  3.  Quick dash will improve to no greater than  15% Baseline:  Goal status: INITIAL  4.  Right shoulder ROM will be improved to within 5 degrees of left shoulder of flexion and abd for improved reaching Baseline:  Goal status: INITIAL  PLAN:  PT FREQUENCY: 2x/week  PT DURATION: 6 weeks  PLANNED INTERVENTIONS: 97164- PT Re-evaluation, 97110-Therapeutic exercises, 97530- Therapeutic activity, 97112- Neuromuscular re-education, 97535- Self Care, 02859- Manual therapy, 97033- Ionotophoresis 4mg /ml Dexamethasone, and Joint mobilization  PLAN FOR NEXT SESSION: thoracic extension; Joint mobs, supine AAROM, PROM, review theraband and progress, progress strength and decrease UT compensation   Jarrell Laming, PT, DPT 08/06/24, 11:48 AM  [facility] [address] [place], [place] [address] Phone # [phone] Fax [phone]

## 2024-08-09 ENCOUNTER — Encounter: Payer: Self-pay | Admitting: Physician Assistant

## 2024-08-09 ENCOUNTER — Ambulatory Visit: Admitting: Physician Assistant

## 2024-08-09 ENCOUNTER — Ambulatory Visit

## 2024-08-09 DIAGNOSIS — R293 Abnormal posture: Secondary | ICD-10-CM

## 2024-08-09 DIAGNOSIS — M6281 Muscle weakness (generalized): Secondary | ICD-10-CM

## 2024-08-09 DIAGNOSIS — G8929 Other chronic pain: Secondary | ICD-10-CM

## 2024-08-09 DIAGNOSIS — M25611 Stiffness of right shoulder, not elsewhere classified: Secondary | ICD-10-CM

## 2024-08-09 DIAGNOSIS — M25511 Pain in right shoulder: Secondary | ICD-10-CM | POA: Diagnosis not present

## 2024-08-09 NOTE — Therapy (Signed)
 OUTPATIENT PHYSICAL THERAPY SHOULDER PROGRESS NOTE   Patient Name: Joanna Floyd MRN: [id number] DOB:02-17-1956, 68 y.o., female Today's Date: 08/09/2024  END OF SESSION:  PT End of Session - 08/09/24 1101     Visit Number 6    Number of Visits 12    Date for PT Re-Evaluation 08/23/24    Authorization Type cohere 12 visits 8/11-9/22    Authorization - Visit Number 6    Authorization - Number of Visits 12    PT Start Time 1101    PT Stop Time 1200    PT Time Calculation (min) 59 min    Activity Tolerance Patient tolerated treatment well    Behavior During Therapy [facility] for tasks assessed/performed          Past Medical History:  Diagnosis Date   Varicose veins of both lower extremities    Past Surgical History:  Procedure Laterality Date   varicose veins Bilateral    Patient Active Problem List   Diagnosis Date Noted   Fecal urgency 08/22/2017   Abdominal bloating 08/21/2017   Generalized abdominal pain 08/21/2017   Irritable bowel syndrome with diarrhea 08/21/2017   Distal radius fracture 07/29/2012    PCP:   REFERRING PROVIDER: Dr. Ronal Dragon Persons  REFERRING DIAG: Chronic Right shoulder pain  THERAPY DIAG:  Chronic right shoulder pain  Muscle weakness (generalized)  Stiffness of right shoulder, not elsewhere classified  Abnormal posture  Rationale for Evaluation and Treatment: Rehabilitation  ONSET DATE: June 2025  SUBJECTIVE:                                                                                                                                                                                      SUBJECTIVE STATEMENT: Pt reports shoulder is doing well. She is semi compliant with HEP. She does well during the week, but forgets at the weekend.  I can pour water into my percolater now. I do still have to support a  water jug when I pour, I am doing better reaching into cabinets. When I walk the dog ( a Husky) I am cautious and I use both hands. I  had some terrible problems with my left leg after the Veins surgery last week with pain and swelling. It is better now.  EVAL:Pt had been working out with a trainer for exercise but did not notice any pain at the time. Every once in a while she would have some shoulder pain. The last time she saw the trainer and did some overhead activities and pain started after that. Pain started in approximately June of this year. She describes pain at the top of the shoulder  that radiates into the right arm mostly to the elbow. She was given a Subacromial injection on 06/22/2024 . After a few days the injection kicked in and pain has improved some but is still moderate. It hurts when she bends her elbow. Hand dominance: Right  PERTINENT HISTORY:  Pain started in approximately June of this year. She describes pain at the top of the shoulder that radiates into the right arm. She was given a Subacromial injection on 06/22/2024   PAIN:  Are you having pain? No not presently, and not over the weekend.    PRECAUTIONS:  pre DM , prior thrombophlebitis LE, IBS,  Having veins worked on 07/20/2024  RED FLAGS: None   WEIGHT BEARING RESTRICTIONS: No  FALLS:  Has patient fallen in last 6 months? 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  PLOF: Independent  PATIENT GOALS: Decrease pain, know when she can return to gym  NEXT MD VISIT: 08/10/2024  OBJECTIVE:  Note: Objective measures were completed at Evaluation unless otherwise noted.  DIAGNOSTIC FINDINGS:  Pt had shoulder xrays 06/22/2024 with no evidence of arthropathy or fracture/dislocation  PATIENT SURVEYS:  Quick Dash: 31.82%  COGNITION: Overall cognitive status: Within functional limits for tasks assessed     SENSATION: WFL  POSTURE: Forward head rounded shoulders  UPPER EXTREMITY ROM:    Active ROM Right eval Left eval RIGHT 08/09/2024  Shoulder flexion 115 130 137  Shoulder extension 45 55   Shoulder abduction 109 138 145  Shoulder adduction     Shoulder internal rotation NT due to compensation 69 stand   Shoulder external rotation NT 100 stand   Elbow flexion WNL    Elbow extension WNL    Wrist flexion     Wrist extension     Wrist ulnar deviation     Wrist radial deviation     Wrist pronation     Wrist supination     (Blank rows = not tested)  UPPER EXTREMITY MMT:  MMT Right eval Left eval  Shoulder flexion 3+   Shoulder extension    Shoulder abduction 3+   Shoulder adduction    Shoulder internal rotation 4   Shoulder external rotation 4   Middle trapezius    Lower trapezius    Elbow flexion 4+   Elbow extension 4+   Wrist flexion    Wrist extension    Wrist ulnar deviation    Wrist radial deviation    Wrist pronation    Wrist supination    Grip strength (lbs)    (Blank rows = not tested)  SHOULDER SPECIAL TESTS: Positive empty can Positive impingement  JOINT MOBILITY TESTING:  NT  PALPATION:  Tender Right supraspinatus insertion and posterior shoulder, Right distal biceps  TREATMENT DATE:   08/09/2024 Discussed gentle leader harness for pt to use with her Husky Pulleys x 2 min flexion and abd with VC's to depress scapula Thoracic extension with ball sitting in chair x 10 Dual cable machine scapular retraction 7lbs ea 2 x 10, shoulder extension 3lbs ea x 10 Red wt.ball 4 D scapular stabs on wall x 20 up and down and side to side, and 10 CW, CCW Shoulder ranger x 10 flex and scaption Biceps curls 3# 2 x 10 Chest press 3# 2 x 10 Serratus punch 3lbs Supine wand flexion and scaption x 5 , emphasis on scapular depression 08/06/2024: Shoulder pulleys for flexion and abduction x2 min each Supine serratus punch with  2# dumbbell 2x10 bilat Supine shoulder flexion with 3# weight on dowel rod 2x10 Supine chest press with 3# weight on dowel rod 2x10 Supine shoulder horizontal abduction with red tband 2x10 Supine shoulder ER with red tband 2x10 Supine alphabet A- Z 2# x 1 bilat Standing rows with red tband 2x10 Standing shoulder extension with red tband 2x10 Standing scapular stabilization with red plyoball on wall performing CW and CCW circles x20 each bilat Standing barre push-ups 2x10   07/30/2024 Discussed pt progress Discussed use of ice to shoulder x 10 min with gel pack that is padded with towel for pain control when needed Dual Cable machine scapular retraction(7 lbs) and shoulder extension ( 3 lbs)2 x 10 reps, VC prn for proper posture Bilateral ER yellow band x 10, Tried cable initially with 3 # but too heavy and poor form so switched to band Supine horizontal abd yellow 2 x 10 Supine alphabet A- Z 1# x 1 Serratus punch 2 # x 15 Supine chest press 2 # 2 x 10 Alternating isometrics ER/IR 2 x 20 sec Rhythmic stab exs 90 degrees flex   07/26/2024 Reviewed HEP given last time. Pt had done thoracic extension incorrectly and had combined with clasped hands flexion Sitting in chair with towel roll; Thoracic extension x 15 Reviewed HEP and progressed to with red band: standing bil external rotation 15x yellow; bil row 15x red, bil shoulder extension red 15x. Band on parallel bars UE Ranger on wall midway: flexion 10x, scaption 10x Supine clasped hands shoulder flexion 10 sec hold 5x,, Wand x 5 reps Supine horizontal abd yellow x 15 Serratus punch 2 # 2 x 10 with PT guidance for form with first 10 Alternating isometrics IR and ER with PT resistance Shoulder flexion 125; no pain   PATIENT EDUCATION: Education details: Educated in standing postural exercises; Scapular retraction, shoulder extension, bilateral ER with yellow band x 10 reps 3 days per week. Gave band and written/illustrated  instructions. Discussed importance of proper posture to locate shoulder better and to prevent UT compensation. Explained impingement syndrome and importance of not forcing through shoulder pain. Person educated: Patient Education method: Explanation, Demonstration, and Handouts Education comprehension: verbalized understanding and returned demonstration  HOME EXERCISE PROGRAM: Access Code: [email or web address] URL: [email or web address] Date: 07/22/2024 Prepared by: Glade Pesa  Exercises - Seated Thoracic Lumbar Extension with Pectoralis Stretch  - 1 x daily - 7 x weekly - 1 sets - 10 reps - Supine Shoulder Flexion AAROM with Hands Clasped  - 1 x daily - 7 x weekly - 1 sets - 3-5 reps - 10 hold  Exercises - Seated Thoracic Lumbar Extension with Pectoralis Stretch  - 1 x daily - 7 x weekly - 1 sets - 10 reps Scapular retraction, shoulder extension, bilateral ER with  yellow band x 10 reps 3 days per week, no pain with exercises  ASSESSMENT:  CLINICAL IMPRESSION:  Pt reports good improvements with pain, and demonstrates excellent improvement in Right shoulder AROM without pain today. She still has some disturbed sleep when lying on the left side, but is able to lie on the right side without complaint.    Eval:Patient is a 68 y.o. female who was seen today for physical therapy evaluation and treatment for complaints of right shoulder pain that started in June. She had been working out with a trainer, but did not notice any pain at the time.  At her last visit she did overhead triceps ext with her trainer and developed right shoulder pain thereafter. She is s/p a Subacromial Injection on 06/22/2024 and is feeling some better since then.  She presents with right shoulder pain, and with limitations in AROM, and noted UT compensation with overhead activities. She has a mild + impingement test, and empty can test. She has poor round shouldered, forward head posture.  She is also having some  right distal biceps tenderness. Her Quick dash score is 31.82%.She was instructed in the importance of posture for proper location of her shoulder within the joint, and in importance of strengthening to prevent UT compensation. She will benefit from skilled PT to address deficits and return to PLOF  OBJECTIVE IMPAIRMENTS: decreased activity tolerance, decreased knowledge of condition, decreased ROM, decreased strength, impaired UE functional use, postural dysfunction, and pain.   ACTIVITY LIMITATIONS: lifting, sleeping, reach over head, and hygiene/grooming  PARTICIPATION LIMITATIONS: cleaning and activities requiring reaching   REHAB POTENTIAL: Good  CLINICAL DECISION MAKING: Evolving/moderate complexity  EVALUATION COMPLEXITY: Moderate   GOALS: Goals reviewed with patient? Yes  SHORT TERM GOALS: Target date: 08/02/2024  Pt will be independent with a HEP Baseline: Goal status: Met on 08/06/24  2.  Pt will have decreased pain by 25% Baseline:  Goal status: MET 08/09/2024  3.  Pt will demonstrate improvements with proper posture Baseline:  Goal status: Ongoing; more aware  LONG TERM GOALS: Target date: 08/23/2024  Pt will report shoulder pain improved by 50% or greater Baseline:  Goal status: INITIAL  2.  Pts sleep will be improved 25-50% Baseline:  Goal status: INITIAL  3.  Quick dash will improve to no greater than  15% Baseline:  Goal status: INITIAL  4.  Right shoulder ROM will be improved to within 5 degrees of left shoulder of flexion and abd for improved reaching Baseline:  Goal status: INITIAL  PLAN:  PT FREQUENCY: 2x/week  PT DURATION: 6 weeks  PLANNED INTERVENTIONS: 97164- PT Re-evaluation, 97110-Therapeutic exercises, 97530- Therapeutic activity, 97112- Neuromuscular re-education, 97535- Self Care, 02859- Manual therapy, 97033- Ionotophoresis 4mg /ml Dexamethasone, and Joint mobilization  PLAN FOR NEXT SESSION: thoracic extension; Joint mobs, supine AAROM,  PROM, review theraband and progress, progress strength and decrease UT compensation   Grayce Sheldon, PT 08/09/24, 12:05 PM  [facility] [address] [place], [place] [address] Phone # [phone] Fax [phone]

## 2024-08-09 NOTE — Progress Notes (Signed)
 Office Visit Note   Patient: Joanna Floyd           Date of Birth: 1956-04-06           MRN: [id number] Visit Date: 08/09/2024              Requested by: Katina Pfeiffer, PA-C [address] [place],  [place] [address] PCP: Katina Pfeiffer, PA-C  No chief complaint on file.     HPI: Patient is a pleasant 68 year old woman who follows up for her right shoulder impingement symptoms.  She has been doing fair therapy and finds it quite helpful.  She did get an injection at her last visit she said it took about 4 days but she had significant relief in her symptoms  Assessment & Plan: Visit Diagnoses:  1. Chronic right shoulder pain     Plan: She is doing a lot better.  She is going to finish therapy in the next few weeks.  She asked about working with her trainer she is concerned that her trainer might not be as specific about positioning and she does not want her shoulder to get worse.  I told her I would leave it to her.  Of course if it is a few months go by and she needs another shot this definitely possibility.  A follow-up or message me is not  Follow-Up Instructions: No follow-ups on file.   Ortho Exam  Patient is alert, oriented, no adenopathy, well-dressed, normal affect, normal respiratory effort. Examination of her right shoulder she has good forward elevation internal rotation behind her back much less findings with impingement sign.  Grip strength is good strength is good    Imaging: No results found. No images are attached to the encounter.  Labs: No results found for: HGBA1C, ESRSEDRATE, CRP, LABURIC, REPTSTATUS, GRAMSTAIN, CULT, LABORGA   No results found for: ALBUMIN, PREALBUMIN, CBC  No results found for: MG No results found for: VD25OH  No results found for: PREALBUMIN    01/16/2008    9:05 AM  CBC EXTENDED  WBC 7.3   RBC 4.53   Hemoglobin 12.1   HCT 35.7   Platelets 238   NEUT# 5.6   Lymph# 1.3      There  is no height or weight on file to calculate BMI.  Orders:  No orders of the defined types were placed in this encounter.  No orders of the defined types were placed in this encounter.    Procedures: No procedures performed  Clinical Data: No additional findings.  ROS:  All other systems negative, except as noted in the HPI. Review of Systems  Objective: Vital Signs: There were no vitals taken for this visit.  Specialty Comments:  No specialty comments available.  PMFS History: Patient Active Problem List   Diagnosis Date Noted   Fecal urgency 08/22/2017   Abdominal bloating 08/21/2017   Generalized abdominal pain 08/21/2017   Irritable bowel syndrome with diarrhea 08/21/2017   Distal radius fracture 07/29/2012   Past Medical History:  Diagnosis Date   Varicose veins of both lower extremities     History reviewed. No pertinent family history.  Past Surgical History:  Procedure Laterality Date   varicose veins Bilateral    Social History   Occupational History   Not on file  Tobacco Use   Smoking status: Former    Current packs/day: 0.00    Types: Cigarettes    Quit date: 05/07/1980    Years since quitting: 44.2  Smokeless tobacco: Never  Substance and Sexual Activity   Alcohol use: Yes    Alcohol/week: 1.0 standard drink of alcohol    Types: 1 Glasses of wine per week    Comment: monthly   Drug use: Never   Sexual activity: Not on file

## 2024-08-10 DIAGNOSIS — I83891 Varicose veins of right lower extremities with other complications: Secondary | ICD-10-CM | POA: Diagnosis not present

## 2024-08-10 DIAGNOSIS — I83811 Varicose veins of right lower extremities with pain: Secondary | ICD-10-CM | POA: Diagnosis not present

## 2024-08-10 DIAGNOSIS — M7989 Other specified soft tissue disorders: Secondary | ICD-10-CM | POA: Diagnosis not present

## 2024-08-12 DIAGNOSIS — F419 Anxiety disorder, unspecified: Secondary | ICD-10-CM | POA: Diagnosis not present

## 2024-08-12 DIAGNOSIS — Z6831 Body mass index (BMI) 31.0-31.9, adult: Secondary | ICD-10-CM | POA: Diagnosis not present

## 2024-08-12 DIAGNOSIS — E118 Type 2 diabetes mellitus with unspecified complications: Secondary | ICD-10-CM | POA: Diagnosis not present

## 2024-08-12 DIAGNOSIS — E7800 Pure hypercholesterolemia, unspecified: Secondary | ICD-10-CM | POA: Diagnosis not present

## 2024-08-13 ENCOUNTER — Ambulatory Visit

## 2024-08-13 DIAGNOSIS — M6281 Muscle weakness (generalized): Secondary | ICD-10-CM

## 2024-08-13 DIAGNOSIS — M25511 Pain in right shoulder: Secondary | ICD-10-CM | POA: Diagnosis not present

## 2024-08-13 DIAGNOSIS — M25611 Stiffness of right shoulder, not elsewhere classified: Secondary | ICD-10-CM

## 2024-08-13 DIAGNOSIS — R293 Abnormal posture: Secondary | ICD-10-CM

## 2024-08-13 DIAGNOSIS — G8929 Other chronic pain: Secondary | ICD-10-CM | POA: Diagnosis not present

## 2024-08-13 NOTE — Therapy (Signed)
 OUTPATIENT PHYSICAL THERAPY SHOULDER TREATMENT   Patient Name: Joanna Floyd MRN: [id number] DOB:October 16, 1956, 68 y.o., female Today's Date: 08/13/2024  END OF SESSION:  PT End of Session - 08/13/24 1106     Visit Number 7    Number of Visits 12    Date for PT Re-Evaluation 08/23/24    Authorization Type cohere 12 visits 8/11-9/22    Authorization - Visit Number 7    PT Start Time 1106    PT Stop Time 1151    PT Time Calculation (min) 45 min    Activity Tolerance Patient tolerated treatment well    Behavior During Therapy [facility] for tasks assessed/performed          Past Medical History:  Diagnosis Date   Varicose veins of both lower extremities    Past Surgical History:  Procedure Laterality Date   varicose veins Bilateral    Patient Active Problem List   Diagnosis Date Noted   Fecal urgency 08/22/2017   Abdominal bloating 08/21/2017   Generalized abdominal pain 08/21/2017   Irritable bowel syndrome with diarrhea 08/21/2017   Distal radius fracture 07/29/2012    PCP:   REFERRING PROVIDER: Dr. Ronal Dragon Persons  REFERRING DIAG: Chronic Right shoulder pain  THERAPY DIAG:  Chronic right shoulder pain  Muscle weakness (generalized)  Stiffness of right shoulder, not elsewhere classified  Abnormal posture  Rationale for Evaluation and Treatment: Rehabilitation  ONSET DATE: June 2025  SUBJECTIVE:                                                                                                                                                                                      SUBJECTIVE STATEMENT:  I saw the PA and she was pleased with my progress. I did not have to schedule another follow up with her. I have not had any pain recently. I took out the appts for next week because I am very busy with the [place]. I can pull a shirt off over my head now without pain.   EVAL:Pt had been working out with a trainer for exercise but did not notice any pain at the  time. Every once in a while she would have some shoulder pain. The last time she saw the trainer and did some overhead activities and pain started after that. Pain started in approximately June of this year. She describes pain at the top of the shoulder that radiates into the right arm mostly to the elbow. She was given a Subacromial injection on 06/22/2024 . After a few days the injection kicked in and pain has improved some but is still moderate. It hurts when she bends  her elbow. Hand dominance: Right  PERTINENT HISTORY:  Pain started in approximately June of this year. She describes pain at the top of the shoulder that radiates into the right arm. She was given a Subacromial injection on 06/22/2024   PAIN:  Are you having pain? No not presently, and not over the weekend.    PRECAUTIONS:  pre DM , prior thrombophlebitis LE, IBS,  Having veins worked on 07/20/2024  RED FLAGS: None   WEIGHT BEARING RESTRICTIONS: No  FALLS:  Has patient fallen in last 6 months? 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  PLOF: Independent  PATIENT GOALS: Decrease pain, know when she can return to gym  NEXT MD VISIT: 08/10/2024  OBJECTIVE:  Note: Objective measures were completed at Evaluation unless otherwise noted.  DIAGNOSTIC FINDINGS:  Pt had shoulder xrays 06/22/2024 with no evidence of arthropathy or fracture/dislocation  PATIENT SURVEYS:  Quick Dash: 31.82%  COGNITION: Overall cognitive status: Within functional limits for tasks assessed     SENSATION: WFL  POSTURE: Forward head rounded shoulders  UPPER EXTREMITY ROM:   Active ROM Right eval Left eval RIGHT 08/09/2024  Shoulder flexion 115 130 137  Shoulder extension 45 55   Shoulder abduction 109 138 145  Shoulder adduction     Shoulder internal rotation NT due to  compensation 69 stand   Shoulder external rotation NT 100 stand   Elbow flexion WNL    Elbow extension WNL    Wrist flexion     Wrist extension     Wrist ulnar deviation     Wrist radial deviation     Wrist pronation     Wrist supination     (Blank rows = not tested)  UPPER EXTREMITY MMT:  MMT Right eval Left eval  Shoulder flexion 3+   Shoulder extension    Shoulder abduction 3+   Shoulder adduction    Shoulder internal rotation 4   Shoulder external rotation 4   Middle trapezius    Lower trapezius    Elbow flexion 4+   Elbow extension 4+   Wrist flexion    Wrist extension    Wrist ulnar deviation    Wrist radial deviation    Wrist pronation    Wrist supination    Grip strength (lbs)    (Blank rows = not tested)  SHOULDER SPECIAL TESTS: Positive empty can Positive impingement  JOINT MOBILITY TESTING:  NT  PALPATION:  Tender Right supraspinatus insertion and posterior shoulder, Right distal biceps                                                                                                                             TREATMENT DATE:   08/13/2024  Ball rolls on wall forward x 5 Shoulder ranger flex x 10, scaption x  10 Reach to high shelf with 1# x 10 Dual cable machine scapular retraction 7lbs ea 2 x 10, shoulder extension 3lbs ea  1 x 15, 1 x 10 Attempted quick catch with red ball but discontinued secondary to difficulty coordinating Wall clock gn weighted ball x 7 4 D ball stabs red wt ball 2 x 10 ea Biceps curls B 5# 2 x 10 Supine chest press 4 # x 12 Serratus punch 4# x10 Supine alphabet 2# x 1 08/09/2024 Discussed gentle leader harness for pt to use with her Husky Pulleys x 2 min flexion and abd with VC's to depress scapula Thoracic extension with ball sitting in chair x 10 Dual cable machine scapular retraction 7lbs ea 2 x 10, shoulder extension 3lbs ea x 10 Red wt.ball 4 D scapular stabs on wall x 20 up and down and side to side, and 10 CW,  CCW Shoulder ranger x 10 flex and scaption Biceps curls 3# 2 x 10 Chest press 3# 2 x 10 Serratus punch 3lbs Supine wand flexion and scaption x 5 , emphasis on scapular depression 08/06/2024: Shoulder pulleys for flexion and abduction x2 min each Supine serratus punch with 2# dumbbell 2x10 bilat Supine shoulder flexion with 3# weight on dowel rod 2x10 Supine chest press with 3# weight on dowel rod 2x10 Supine shoulder horizontal abduction with red tband 2x10 Supine shoulder ER with red tband 2x10 Supine alphabet A- Z 2# x 1 bilat Standing rows with red tband 2x10 Standing shoulder extension with red tband 2x10 Standing scapular stabilization with red plyoball on wall performing CW and CCW circles x20 each bilat Standing barre push-ups 2x10   07/30/2024 Discussed pt progress Discussed use of ice to shoulder x 10 min with gel pack that is padded with towel for pain control when needed Dual Cable machine scapular retraction(7 lbs) and shoulder extension ( 3 lbs)2 x 10 reps, VC prn for proper posture Bilateral ER yellow band x 10, Tried cable initially with 3 # but too heavy and poor form so switched to band Supine horizontal abd yellow 2 x 10 Supine alphabet A- Z 1# x 1 Serratus punch 2 # x 15 Supine chest press 2 # 2 x 10 Alternating isometrics ER/IR 2 x 20 sec Rhythmic stab exs 90 degrees flex   07/26/2024 Reviewed HEP given last time. Pt had done thoracic extension incorrectly and had combined with clasped hands flexion Sitting in chair with towel roll; Thoracic extension x 15 Reviewed HEP and progressed to with red band: standing bil external rotation 15x yellow; bil row 15x red, bil shoulder extension red 15x. Band on parallel bars UE Ranger on wall midway: flexion 10x, scaption 10x Supine clasped hands shoulder flexion 10 sec hold 5x,, Wand x 5 reps Supine horizontal abd yellow x 15 Serratus punch 2 # 2 x 10 with PT guidance for form with first 10 Alternating isometrics IR and  ER with PT resistance Shoulder flexion 125; no pain   PATIENT EDUCATION: Education details: Educated in standing postural exercises; Scapular retraction, shoulder extension, bilateral ER with yellow band x 10 reps 3 days per week. Gave band and written/illustrated instructions. Discussed importance of proper posture to locate shoulder better and to prevent UT compensation. Explained impingement syndrome and importance of not forcing through shoulder pain. Person educated: Patient Education method: Explanation, Demonstration, and Handouts Education comprehension: verbalized understanding and returned demonstration  HOME EXERCISE PROGRAM: Access Code: [email or web address] URL: [email or web address] Date: 07/22/2024 Prepared by: Glade Pesa  Exercises - Seated  Thoracic Lumbar Extension with Pectoralis Stretch  - 1 x daily - 7 x weekly - 1 sets - 10 reps - Supine Shoulder Flexion AAROM with Hands Clasped  - 1 x daily - 7 x weekly - 1 sets - 3-5 reps - 10 hold  Exercises - Seated Thoracic Lumbar Extension with Pectoralis Stretch  - 1 x daily - 7 x weekly - 1 sets - 10 reps Scapular retraction, shoulder extension, bilateral ER with yellow band x 10 reps 3 days per week, no pain with exercises  ASSESSMENT:  CLINICAL IMPRESSION: Pt has been without pain for over a week. AROM with reaching and activities using resistance has improved and pt has made been able to  progress reps and resistance on selected exercises.     Eval:Patient is a 68 y.o. female who was seen today for physical therapy evaluation and treatment for complaints of right shoulder pain that started in June. She had been working out with a trainer, but did not notice any pain at the time.  At her last visit she did overhead triceps ext with her trainer and developed right shoulder pain thereafter. She is s/p a Subacromial Injection on 06/22/2024 and is feeling some better since then.  She presents with right shoulder pain,  and with limitations in AROM, and noted UT compensation with overhead activities. She has a mild + impingement test, and empty can test. She has poor round shouldered, forward head posture.  She is also having some right distal biceps tenderness. Her Quick dash score is 31.82%.She was instructed in the importance of posture for proper location of her shoulder within the joint, and in importance of strengthening to prevent UT compensation. She will benefit from skilled PT to address deficits and return to PLOF  OBJECTIVE IMPAIRMENTS: decreased activity tolerance, decreased knowledge of condition, decreased ROM, decreased strength, impaired UE functional use, postural dysfunction, and pain.   ACTIVITY LIMITATIONS: lifting, sleeping, reach over head, and hygiene/grooming  PARTICIPATION LIMITATIONS: cleaning and activities requiring reaching   REHAB POTENTIAL: Good  CLINICAL DECISION MAKING: Evolving/moderate complexity  EVALUATION COMPLEXITY: Moderate   GOALS: Goals reviewed with patient? Yes  SHORT TERM GOALS: Target date: 08/02/2024  Pt will be independent with a HEP Baseline: Goal status: Met on 08/06/24  2.  Pt will have decreased pain by 25% Baseline:  Goal status: MET 08/09/2024  3.  Pt will demonstrate improvements with proper posture Baseline:  Goal status: Ongoing; more aware  LONG TERM GOALS: Target date: 08/23/2024  Pt will report shoulder pain improved by 50% or greater Baseline:  Goal status: INITIAL  2.  Pts sleep will be improved 25-50% Baseline:  Goal status: INITIAL  3.  Quick dash will improve to no greater than  15% Baseline:  Goal status: INITIAL  4.  Right shoulder ROM will be improved to within 5 degrees of left shoulder of flexion and abd for improved reaching Baseline:  Goal status: INITIAL  PLAN:  PT FREQUENCY: 2x/week  PT DURATION: 6 weeks  PLANNED INTERVENTIONS: 97164- PT Re-evaluation, 97110-Therapeutic exercises, 97530- Therapeutic activity,  97112- Neuromuscular re-education, 97535- Self Care, 02859- Manual therapy, 97033- Ionotophoresis 4mg /ml Dexamethasone, and Joint mobilization  PLAN FOR NEXT SESSION: thoracic extension; Joint mobs, supine AAROM, PROM, review theraband and progress, progress strength and decrease UT compensation   Grayce Sheldon, PT 08/13/24, 12:19 PM  [facility] [address], [address] [place], [place] [address] Phone # [phone] Fax [phone]

## 2024-08-16 ENCOUNTER — Ambulatory Visit

## 2024-08-17 DIAGNOSIS — I87392 Chronic venous hypertension (idiopathic) with other complications of left lower extremity: Secondary | ICD-10-CM | POA: Diagnosis not present

## 2024-08-17 DIAGNOSIS — I83892 Varicose veins of left lower extremities with other complications: Secondary | ICD-10-CM | POA: Diagnosis not present

## 2024-08-19 ENCOUNTER — Ambulatory Visit: Admitting: Rehabilitative and Restorative Service Providers"

## 2024-08-23 ENCOUNTER — Ambulatory Visit

## 2024-08-23 DIAGNOSIS — M25611 Stiffness of right shoulder, not elsewhere classified: Secondary | ICD-10-CM

## 2024-08-23 DIAGNOSIS — M25511 Pain in right shoulder: Secondary | ICD-10-CM | POA: Diagnosis not present

## 2024-08-23 DIAGNOSIS — G8929 Other chronic pain: Secondary | ICD-10-CM | POA: Diagnosis not present

## 2024-08-23 DIAGNOSIS — R293 Abnormal posture: Secondary | ICD-10-CM

## 2024-08-23 DIAGNOSIS — M6281 Muscle weakness (generalized): Secondary | ICD-10-CM | POA: Diagnosis not present

## 2024-08-23 NOTE — Therapy (Signed)
 OUTPATIENT PHYSICAL THERAPY SHOULDER TREATMENT   Patient Name: Joanna Floyd MRN: [id number] DOB:Dec 30, 1955, 68 y.o., female Today's Date: 08/23/2024  END OF SESSION:  PT End of Session - 08/23/24 1102     Visit Number 8    Number of Visits 12    Date for Recertification  08/23/24    Authorization - Visit Number 8    Authorization - Number of Visits 12    PT Start Time 1105    PT Stop Time 1158    PT Time Calculation (min) 53 min    Activity Tolerance Patient tolerated treatment well    Behavior During Therapy [facility] for tasks assessed/performed          Past Medical History:  Diagnosis Date   Varicose veins of both lower extremities    Past Surgical History:  Procedure Laterality Date   varicose veins Bilateral    Patient Active Problem List   Diagnosis Date Noted   Fecal urgency 08/22/2017   Abdominal bloating 08/21/2017   Generalized abdominal pain 08/21/2017   Irritable bowel syndrome with diarrhea 08/21/2017   Distal radius fracture 07/29/2012    PCP:   REFERRING PROVIDER: Dr. Ronal Dragon Persons  REFERRING DIAG: Chronic Right shoulder pain  THERAPY DIAG:  Chronic right shoulder pain  Muscle weakness (generalized)  Stiffness of right shoulder, not elsewhere classified  Abnormal posture  Rationale for Evaluation and Treatment: Rehabilitation  ONSET DATE: June 2025  SUBJECTIVE:                                                                                                                                                                                      SUBJECTIVE STATEMENT:  My shoulder has been good and I haven't experienced any pain there. I had some pain yesterday in my elbow and its about gone today.  I can get shirts on and off over my head better and I can reach into cabinets now. I am sleeping much better and I can pour water into my percolator. I have been busy a the [place] and packaging things and have done well.   EVAL:Pt  had been working out with a trainer for exercise but did not notice any pain at the time. Every once in a while she would have some shoulder pain. The last time she saw the trainer and did some overhead activities and pain started after that. Pain started in approximately June of this year. She describes pain at the top of the shoulder that radiates into the right arm mostly to the elbow. She was given a Subacromial injection on 06/22/2024 . After a few days the injection kicked  in and pain has improved some but is still moderate. It hurts when she bends her elbow. Hand dominance: Right  PERTINENT HISTORY:  Pain started in approximately June of this year. She describes pain at the top of the shoulder that radiates into the right arm. She was given a Subacromial injection on 06/22/2024   PAIN:  Are you having pain? No      PRECAUTIONS:  pre DM , prior thrombophlebitis LE, IBS,  Having veins worked on 07/20/2024  RED FLAGS: None   WEIGHT BEARING RESTRICTIONS: No  FALLS:  Has patient fallen in last 6 months? 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  PLOF: Independent  PATIENT GOALS: Decrease pain, know when she can return to gym  NEXT MD VISIT: 08/10/2024  OBJECTIVE:  Note: Objective measures were completed at Evaluation unless otherwise noted.  DIAGNOSTIC FINDINGS:  Pt had shoulder xrays 06/22/2024 with no evidence of arthropathy or fracture/dislocation  PATIENT SURVEYS:  Quick Dash: 31.82%, 08/23/2024 13. 6  COGNITION: Overall cognitive status: Within functional limits for tasks assessed     SENSATION: WFL  POSTURE: Forward head rounded shoulders  UPPER EXTREMITY ROM:   Active ROM Right eval Left eval RIGHT 08/09/2024 RIGHT 08/23/2024 LEFT 08/23/2024  Shoulder flexion 115 130 137 137 133  Shoulder extension 45 55      Shoulder abduction 109 138 145 165 142  Shoulder adduction       Shoulder internal rotation NT due to compensation 69 stand     Shoulder external rotation NT 100 stand     Elbow flexion WNL      Elbow extension WNL      Wrist flexion       Wrist extension       Wrist ulnar deviation       Wrist radial deviation       Wrist pronation       Wrist supination       (Blank rows = not tested)  UPPER EXTREMITY MMT:  MMT Right eval Left eval  Shoulder flexion 3+   Shoulder extension    Shoulder abduction 3+   Shoulder adduction    Shoulder internal rotation 4   Shoulder external rotation 4   Middle trapezius    Lower trapezius    Elbow flexion 4+   Elbow extension 4+   Wrist flexion    Wrist extension    Wrist ulnar deviation    Wrist radial deviation    Wrist pronation    Wrist supination    Grip strength (lbs)    (Blank rows = not tested)  SHOULDER SPECIAL TESTS: Positive empty can Positive impingement  JOINT MOBILITY TESTING:  NT  PALPATION:  Tender Right supraspinatus insertion and posterior shoulder, Right distal biceps  TREATMENT DATE:  08/23/2024 Discussed pt progress toward goals and reviewed quick dash Ball rolls on wall forward x 5 Shoulder ranger flex x 10, scaption x 10 Reach to high shelf with 1# x 10 Dual cable machine scapular retraction 7lbs ea 2 x 10, shoulder extension 3lbs ea  1 x 15, 1 x 10 Wall clock gn weighted ball x 7 Biceps curls 5 # B 2 x 10 4 D ball rolls on wall x 20  Discussed importance of doing HEP, and proper way to progress reps/resistance. Showed how she can use bands to simulate most exercises with wts. 08/13/2024  Ball rolls on wall forward x 5 Shoulder ranger flex x 10, scaption x 10 Reach to high shelf with 1# x 10 Dual cable machine scapular retraction 7lbs ea 2 x 10, shoulder extension 3lbs ea  1  x 15, 1 x 10 Attempted quick catch with red ball but discontinued secondary to difficulty coordinating Wall clock gn weighted ball x 7 4 D ball stabs red wt ball 2 x 10 ea Biceps curls B 5# 2 x 10 Supine chest press 4 # x 12 Serratus punch 4# x10 Supine alphabet 2# x 1 08/09/2024 Discussed gentle leader harness for pt to use with her Husky Pulleys x 2 min flexion and abd with VC's to depress scapula Thoracic extension with ball sitting in chair x 10 Dual cable machine scapular retraction 7lbs ea 2 x 10, shoulder extension 3lbs ea x 10 Red wt.ball 4 D scapular stabs on wall x 20 up and down and side to side, and 10 CW, CCW Shoulder ranger x 10 flex and scaption Biceps curls 3# 2 x 10 Chest press 3# 2 x 10 Serratus punch 3lbs Supine wand flexion and scaption x 5 , emphasis on scapular depression 08/06/2024: Shoulder pulleys for flexion and abduction x2 min each Supine serratus punch with 2# dumbbell 2x10 bilat Supine shoulder flexion with 3# weight on dowel rod 2x10 Supine chest press with 3# weight on dowel rod 2x10 Supine shoulder horizontal abduction with red tband 2x10 Supine shoulder ER with red tband 2x10 Supine alphabet A- Z 2# x 1 bilat Standing rows with red tband 2x10 Standing shoulder extension with red tband 2x10 Standing scapular stabilization with red plyoball on wall performing CW and CCW circles x20 each bilat Standing barre push-ups 2x10   07/30/2024 Discussed pt progress Discussed use of ice to shoulder x 10 min with gel pack that is padded with towel for pain control when needed Dual Cable machine scapular retraction(7 lbs) and shoulder extension ( 3 lbs)2 x 10 reps, VC prn for proper posture Bilateral ER yellow band x 10, Tried cable initially with 3 # but too heavy and poor form so switched to band Supine horizontal abd yellow 2 x 10 Supine alphabet A- Z 1# x 1 Serratus punch 2 # x 15 Supine chest press 2 # 2 x 10 Alternating isometrics ER/IR 2 x 20  sec Rhythmic stab exs 90 degrees flex   07/26/2024 Reviewed HEP given last time. Pt had done thoracic extension incorrectly and had combined with clasped hands flexion Sitting in chair with towel roll; Thoracic extension x 15 Reviewed HEP and progressed to with red band: standing bil external rotation 15x yellow; bil row 15x red, bil shoulder extension red 15x. Band on parallel bars UE Ranger on wall midway: flexion 10x, scaption 10x Supine clasped hands shoulder flexion 10 sec hold 5x,, Wand x 5 reps Supine horizontal abd yellow x 15  Serratus punch 2 # 2 x 10 with PT guidance for form with first 10 Alternating isometrics IR and ER with PT resistance Shoulder flexion 125; no pain   PATIENT EDUCATION: Education details: Educated in standing postural exercises; Scapular retraction, shoulder extension, bilateral ER with yellow band x 10 reps 3 days per week. Gave band and written/illustrated instructions. Discussed importance of proper posture to locate shoulder better and to prevent UT compensation. Explained impingement syndrome and importance of not forcing through shoulder pain. Person educated: Patient Education method: Explanation, Demonstration, and Handouts Education comprehension: verbalized understanding and returned demonstration  HOME EXERCISE PROGRAM: Access Code: [email or web address] URL: [email or web address] Date: 07/22/2024 Prepared by: Glade Pesa  Exercises - Seated Thoracic Lumbar Extension with Pectoralis Stretch  - 1 x daily - 7 x weekly - 1 sets - 10 reps - Supine Shoulder Flexion AAROM with Hands Clasped  - 1 x daily - 7 x weekly - 1 sets - 3-5 reps - 10 hold  Exercises - Seated Thoracic Lumbar Extension with Pectoralis Stretch  - 1 x daily - 7 x weekly - 1 sets - 10 reps Scapular retraction, shoulder extension, bilateral ER with yellow band x 10 reps 3 days per week, no pain with exercises  ASSESSMENT:  CLINICAL IMPRESSION:  Pt has achieved all goals  established at initial evaluation. Her AROM has improved significantly, and her right shoulder ROM is now better than the left. She has had no shoulder pain in several weeks. She is much more aware of her posture. She feels ready to be released to a HEP, but was advised she may return for PT if needed with a new referral. Pt encouraged to continue with her HEP at least 3 days per week.    Eval:Patient is a 68 y.o. female who was seen today for physical therapy evaluation and treatment for complaints of right shoulder pain that started in June. She had been working out with a trainer, but did not notice any pain at the time.  At her last visit she did overhead triceps ext with her trainer and developed right shoulder pain thereafter. She is s/p a Subacromial Injection on 06/22/2024 and is feeling some better since then.  She presents with right shoulder pain, and with limitations in AROM, and noted UT compensation with overhead activities. She has a mild + impingement test, and empty can test. She has poor round shouldered, forward head posture.  She is also having some right distal biceps tenderness. Her Quick dash score is 31.82%.She was instructed in the importance of posture for proper location of her shoulder within the joint, and in importance of strengthening to prevent UT compensation. She will benefit from skilled PT to address deficits and return to PLOF  OBJECTIVE IMPAIRMENTS: decreased activity tolerance, decreased knowledge of condition, decreased ROM, decreased strength, impaired UE functional use, postural dysfunction, and pain.   ACTIVITY LIMITATIONS: lifting, sleeping, reach over head, and hygiene/grooming  PARTICIPATION LIMITATIONS: cleaning and activities requiring reaching   REHAB POTENTIAL: Good  CLINICAL DECISION MAKING: Evolving/moderate complexity  EVALUATION COMPLEXITY: Moderate   GOALS: Goals reviewed with patient? Yes  SHORT TERM GOALS: Target date: 08/02/2024  Pt will  be independent with a HEP Baseline: Goal status: Met on 08/06/24  2.  Pt will have decreased pain by 25% Baseline:  Goal status: MET 08/09/2024  3.  Pt will demonstrate improvements with proper posture Baseline:  Goal status: Ongoing; more aware  LONG TERM GOALS: Target date: 08/23/2024  Pt will report shoulder  pain improved by 50% or greater Baseline:  Goal status: MET 08/23/2024  2.  Pts sleep will be improved 25-50% Baseline:  Goal status: MET 08/23/2024 3.  Quick dash will improve to no greater than  15% Baseline:  Goal status: MET 08/23/2024  4.  Right shoulder ROM will be improved to within 5 degrees of left shoulder of flexion and abd for improved reaching Baseline:  Goal status: MET 08/23/2024 Exceeds left shoulder  PLAN:  PT FREQUENCY: 2x/week  PT DURATION: 6 weeks  PLANNED INTERVENTIONS: 97164- PT Re-evaluation, 97110-Therapeutic exercises, 97530- Therapeutic activity, 97112- Neuromuscular re-education, 97535- Self Care, 02859- Manual therapy, 97033- Ionotophoresis 4mg /ml Dexamethasone, and Joint mobilization  PLAN FOR NEXT SESSION: Pt has been discharged to continue with independent self management.  PHYSICAL THERAPY DISCHARGE SUMMARY  Visits from Start of Care: 8  Current functional level related to goals / functional outcomes: Achieved goals   Remaining deficits: None   Education / Equipment: HEP, theraband   Patient agrees to discharge. Patient goals were met. Patient is being discharged due to meeting the stated rehab goals.  Grayce Sheldon, PT 08/23/24, 11:59 AM  [facility] [address], [address] [place], [place] [address] Phone # [phone] Fax [phone]

## 2024-08-27 DIAGNOSIS — I83891 Varicose veins of right lower extremities with other complications: Secondary | ICD-10-CM | POA: Diagnosis not present

## 2024-08-27 DIAGNOSIS — I87391 Chronic venous hypertension (idiopathic) with other complications of right lower extremity: Secondary | ICD-10-CM | POA: Diagnosis not present

## 2024-09-23 ENCOUNTER — Ambulatory Visit (HOSPITAL_BASED_OUTPATIENT_CLINIC_OR_DEPARTMENT_OTHER): Admitting: Student

## 2024-09-23 DIAGNOSIS — M25511 Pain in right shoulder: Secondary | ICD-10-CM | POA: Diagnosis not present

## 2024-09-23 DIAGNOSIS — G8929 Other chronic pain: Secondary | ICD-10-CM | POA: Diagnosis not present

## 2024-09-23 MED ORDER — LIDOCAINE HCL 1 % IJ SOLN
4.0000 mL | INTRAMUSCULAR | Status: AC | PRN
  Administered 2024-09-23: 4 mL

## 2024-09-23 MED ORDER — TRIAMCINOLONE ACETONIDE 40 MG/ML IJ SUSP
2.0000 mL | INTRAMUSCULAR | Status: AC | PRN
  Administered 2024-09-23: 2 mL via INTRA_ARTICULAR

## 2024-09-23 NOTE — Addendum Note (Signed)
 Addended by: MARCINE HUSBAND T on: 09/23/2024 01:33 PM   Modules accepted: Orders

## 2024-09-23 NOTE — Progress Notes (Signed)
 Chief Complaint: Right shoulder pain     History of Present Illness:    Joanna Floyd is a 68 y.o. right-hand-dominant female who presents today for follow-up of right shoulder pain.  She was initially seen by my colleague Ronal Dragon on 7/22 for suspected rotator cuff impingement.  She received a subacromial cortisone injection and referral for physical therapy at that time.  Per patient report and review of physical therapy notes, these gave her significant relief and she was discharged from PT 1 month ago with continuation of a home exercise program and was not having any symptoms at that time.  Today she reports that the shoulder pain has returned which is located in the anterior and lateral upper arm and is typically worse at night.  She does have some occasional pain in the elbow as well as mild tingling in the fingers.  Has been taking aspirin and Voltaren.   Surgical History:   None  PMH/PSH/Family History/Social History/Meds/Allergies:    Past Medical History:  Diagnosis Date   Varicose veins of both lower extremities    Past Surgical History:  Procedure Laterality Date   varicose veins Bilateral    Social History   Socioeconomic History   Marital status: Married    Spouse name: Not on file   Number of children: Not on file   Years of education: Not on file   Highest education level: Not on file  Occupational History   Not on file  Tobacco Use   Smoking status: Former    Current packs/day: 0.00    Types: Cigarettes    Quit date: 05/07/1980    Years since quitting: 44.4   Smokeless tobacco: Never  Substance and Sexual Activity   Alcohol use: Yes    Alcohol/week: 1.0 standard drink of alcohol    Types: 1 Glasses of wine per week    Comment: monthly   Drug use: Never   Sexual activity: Not on file  Other Topics Concern   Not on file  Social History Narrative   Not on file   Social Drivers of Health   Financial Resource  Strain: Not on file  Food Insecurity: Not on file  Transportation Needs: Not on file  Physical Activity: Not on file  Stress: Not on file  Social Connections: Unknown (04/12/2022)   Received from [facility]   Social Network    Social Network: Not on file   No family history on file. No Known Allergies Current Outpatient Medications  Medication Sig Dispense Refill   clonazePAM (KLONOPIN) 0.5 MG tablet Take 0.5 mg by mouth 2 (two) times daily as needed for anxiety.     colestipol (COLESTID) 1 g tablet Take 1 g by mouth daily.     Colloidal Oatmeal (ECZEMA MOISTURIZING) 1 % LOTN Apply topically.     dicyclomine (BENTYL) 20 MG tablet Take by mouth.     hydrocortisone 2.5 % cream hydrocortisone 2.5 % topical cream  APPLY BID     Hyoscyamine Sulfate 0.375 MG TBCR Take by mouth 2 (two) times daily.     Semaglutide,0.25 or 0.5MG /DOS, (OZEMPIC, 0.25 OR 0.5 MG/DOSE,) 2 MG/1.5ML SOPN Inject into the skin.     triamcinolone cream (KENALOG) 0.1 % Apply topically.     No current facility-administered medications for this visit.  No results found.  Review of Systems:   A ROS was performed including pertinent positives and negatives as documented in the HPI.  Physical Exam :   Constitutional: NAD and appears stated age Neurological: Alert and oriented Psych: Appropriate affect and cooperative There were no vitals taken for this visit.   Comprehensive Musculoskeletal Exam:    Tenderness in the right shoulder over the lateral deltoid.  No tenderness over the glenohumeral joint, upper trapezius, or cervical spine.  Full cervical range of motion in all planes without pain.  Active range of motion to 160 degrees flexion, 40 degrees external rotation with pain, and internal rotation to L2.  Positive Neer, Hawkins, and pain with empty can.  No point tenderness at the elbow.  Mild symptoms elicited into the thumb and middle finger with Tinel's test at the wrist.  Imaging:   Xray review from  06/22/2024 (right shoulder 3 views): No acute abnormalities with minimal degenerative changes within the glenohumeral joint.   I personally reviewed and interpreted the radiographs.   Assessment:   68 y.o. female with chronic right shoulder pain that does appear consistent with rotator cuff impingement.  She had a subacromial cortisone injection performed 3 months ago which gave her good relief and symptoms had seemingly resolved with subsequent physical therapy.  Patient reports that she likes to stay active in performing tasks around the house and this is interfering with her ability to do so.  Today she does report some symptoms traveling into the elbow and occasionally into the hand.  She is not having any neck pain and at this point have low suspicion for radiculopathy but this may be more reflective of mild underlying carpal tunnel syndrome.  Given that she did get good benefit from injection and PT, we discussed treatment options and ultimately plan to repeat these today.  Subacromial injection was performed into the right shoulder which she tolerated well.  Will plan to send referral back to physical therapy in order to resume her rehab.  If symptoms recur or persist despite these therapies, would likely pursue an MRI for further investigation.  Plan :    - Right shoulder subacromial injection performed today and referral back to physical therapy for continued strengthening - Return to clinic as needed    Procedure Note  Patient: Joanna Floyd             Date of Birth: Jul 26, 1956           MRN: [id number]             Visit Date: 09/23/2024  Procedures: Visit Diagnoses:  1. Chronic right shoulder pain     Large Joint Inj: R subacromial bursa on 09/23/2024 12:05 PM Indications: pain Details: 22 G 1.5 in needle, posterior approach Medications: 4 mL lidocaine  1 %; 2 mL triamcinolone acetonide 40 MG/ML Outcome: tolerated well, no immediate complications Procedure, treatment  alternatives, risks and benefits explained, specific risks discussed. Consent was given by the patient. Immediately prior to procedure a time out was called to verify the correct patient, procedure, equipment, support staff and site/side marked as required. Patient was prepped and draped in the usual sterile fashion.       I personally saw and evaluated the patient, and participated in the management and treatment plan.  Leonce Reveal, PA-C Orthopedics

## 2024-09-24 DIAGNOSIS — I8001 Phlebitis and thrombophlebitis of superficial vessels of right lower extremity: Secondary | ICD-10-CM | POA: Diagnosis not present

## 2024-10-01 NOTE — Therapy (Signed)
 OUTPATIENT PHYSICAL THERAPY UPPER EXTREMITY EVALUATION   Patient Name: Joanna Floyd MRN: [id number] DOB:[birth year], 68 y.o., female Today's Date: 10/04/2024  END OF SESSION:  PT End of Session - 10/04/24 1150     Visit Number 1    Number of Visits 12    Date for Recertification  11/15/24    PT Start Time 1102    PT Stop Time 1150    PT Time Calculation (min) 48 min    Activity Tolerance Patient tolerated treatment well    Behavior During Therapy [facility] for tasks assessed/performed          Past Medical History:  Diagnosis Date   Varicose veins of both lower extremities    Past Surgical History:  Procedure Laterality Date   varicose veins Bilateral    Patient Active Problem List   Diagnosis Date Noted   Fecal urgency 08/22/2017   Abdominal bloating 08/21/2017   Generalized abdominal pain 08/21/2017   Irritable bowel syndrome with diarrhea 08/21/2017   Distal radius fracture 07/29/2012    PCP:   REFERRING PROVIDER: Leonce Reveal, PA  REFERRING DIAG: Chronic Right shoulder pain  THERAPY DIAG:  Chronic right shoulder pain  Muscle weakness (generalized)  Stiffness of right shoulder, not elsewhere classified  Abnormal posture  Rationale for Evaluation and Treatment: Rehabilitation  ONSET DATE: June 2025 exacerbated again in October  SUBJECTIVE:                                                                                                                                                                                      SUBJECTIVE STATEMENT:  My shoulder started hurting worse the first weekend in October helping my husband in the yard spreading mulch and lifting and handing landscaping bricks . Every little thing I did bothered me. I couldn't sleep and it hurt so bad.  I had another injection on 09/23/2024 and it has been better since then. Still hard to reach with my right hand. I am sleeping OK since the injection. I can't lift heavy things. Still  difficult to sweep/ vacuum. I have not returned to my normal activities yet. Pain radiates down the front of my right arm, past the elbow. Sometimes there is tingling in my hand.   Hand dominance: Right  PERTINENT HISTORY: Pt had been working out with a trainer for exercise but did not notice any pain at the time. Every once in a while she would have some shoulder pain. The last time she saw the trainer and did some overhead activities and pain started after that. Pain started in approximately June of this year. She describes pain at the top of the  shoulder that radiates into the right arm mostly to the elbow. She was given a Subacromial injection on 06/22/2024 . After a few days the injection kicked in and pain has improved some but is still moderate. It hurts when she bends her elbow. She was discharged from formal PT on 08/23/2024 with good relied of pain. She had a second injection on 09/23/2024 with good relief  PAIN:  Are you having pain? Yes: NPRS scale: 2/10 at rest, 3/10 worst since shot  Pain location: right shoulder Pain description: anterior shoulder to elbow and into forearm, sharp before injection Aggravating factors: lifting percolater, heavier things, Relieving factors: injection, Tylenol , motrin  PM  PRECAUTIONS: pre DM , prior thrombophlebitis LE, IBS,   RED FLAGS: None   WEIGHT BEARING RESTRICTIONS: No  FALLS:  Has patient fallen in last 6 months? 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   PLOF: Independent  PATIENT GOALS: Decrease pain  NEXT MD VISIT:   OBJECTIVE:  Note: Objective measures were completed at Evaluation unless otherwise noted.  DIAGNOSTIC FINDINGS:  No new Xrays: good joint space  PATIENT SURVEYS :  Quick Dash:  QUICK DASH  Please rate your ability do the following activities in the last week by  selecting the number below the appropriate response.   Activities Rating  Open a tight or new jar.  2 = Mild difficulty  Do heavy household chores (e.g., wash walls, floors). 2 = Mild difficulty  Carry a shopping bag or briefcase 3 = Moderate difficulty  Wash your back. 2 = Mild difficulty  Use a knife to cut food. 1 = No difficulty   Recreational activities in which you take some force or impact through your arm, shoulder or hand (e.g., golf, hammering, tennis, etc.). 2 = Mild difficulty  During the past week, to what extent has your arm, shoulder or hand problem interfered with your normal social activities with family, friends, neighbors or groups?  2 = Slightly  During the past week, were you limited in your work or other regular daily activities as a result of your arm, shoulder or hand problem? 3 = Moderately limited  Rate the severity of the following symptoms in the last week: Arm, Shoulder, or hand pain. 2 = Mild  Rate the severity of the following symptoms in the last week: Tingling (pins and needles) in your arm, shoulder or hand. 2 = Mild  During the past week, how much difficulty have you had sleeping because of the pain in your arm, shoulder or hand?  2 = Mild difficulty   (A QuickDASH score may not be calculated if there is greater than 1 missing item.)  Quick Dash Disability/Symptom Score: = 27.27%  Minimally Clinically Important Difference (MCID): 15-20 points  Flavio, F. et al. (2013). Minimally clinically important difference of the disabilities of the arm, shoulder, and hand outcome measures (DASH) and its shortened version (Quick DASH). Journal of Orthopaedic & Sports Physical Therapy, 44(1), 30-39)   COGNITION: Overall cognitive status: Within functional limits for tasks assessed     SENSATION: WFL  POSTURE: Forward head rounded shoulders, increased thoracic kyphosis     Cervical Screen: negative  UPPER EXTREMITY ROM:   Active ROM Right eval  Left eval  Shoulder flexion 129 120  Shoulder extension 54 60  Shoulder abduction 123 compensates 125 compensates  Shoulder adduction  Shoulder internal rotation 53 + 70  Shoulder external rotation 70 83  Elbow flexion    Elbow extension    Wrist flexion    Wrist extension    Wrist ulnar deviation    Wrist radial deviation    Wrist pronation    Wrist supination    (Blank rows = not tested)  UPPER EXTREMITY MMT:  MMT Right eval Left eval  Shoulder flexion 4   Shoulder extension    Shoulder abduction 4-   Shoulder adduction    Shoulder internal rotation 4+   Shoulder external rotation 4   Middle trapezius    Lower trapezius    Elbow flexion 5   Elbow extension 5   Wrist flexion    Wrist extension    Wrist ulnar deviation    Wrist radial deviation    Wrist pronation    Wrist supination    Grip strength (lbs)    (Blank rows = not tested)  SHOULDER SPECIAL TESTS: Impingement tests: Hawkins/Kennedy impingement test: positive  Rotator cuff assessment: Empty can test: positive         PALPATION:  Right supraspinatus mildly. No tenderness to prox biceps mid delt or any other areas                                                                                                                             TREATMENT DATE:  10/04/2024 Reviewed impingement and reasons for impingement. Scapular compensation still very present Bilaterally with overhead movements. Discussed importance of using ice(gel pack in toweling) when things first get exacerbated to decrease inflammation. Reviewed proper posture with exercises and pt performed scapular retraction, shoulder extension and bilateral ER all with yellow band x 10 using good breathing technique. Pt to start with yellow and may do 10 + 5 reps with yellow with good posture and never working into pain   PATIENT EDUCATION: Education details: Postural band exs, reasons for impingement, avoid working arms with trainer  temporarily Person educated: Patient Education method: Medical Illustrator Education comprehension: verbalized understanding and returned demonstration  HOME EXERCISE PROGRAM: Postural band;Scapular retraction, shoulder extension, bilateral ER with yellow  ASSESSMENT:  CLINICAL IMPRESSION:  Patient is a 68 y.o. female who was seen today for physical therapy evaluation and treatment for complaints of right shoulder pain that started in June. She had been working out with a trainer, but did not notice any pain at the time. At her last visit she did overhead triceps ext with her trainer and developed right shoulder pain thereafter. She is s/p a Subacromial Injection on 06/22/2024 and is feeling some better since then.  She had 8 PT visits and was discharged on 08/23/2024 after meeting all of her goals.  She experienced increased shoulder pain in late  October after working in the yard with her husband spreading mulch and lifting landscape bricks and passing to her husband.She saw the Dr. Again on 09/23/2024 and was given another injection  and referred for PT.She presents with right shoulder pain, and with limitations in AROM, and noted UT compensation with overhead activities. She has a mild + impingement test, and empty can test. She has poor round shouldered, forward head posture . Quick dash 27.27 %. She will benefit from  skilled physical therapy to address deficits and to return to a more functional lifestyle.  OBJECTIVE IMPAIRMENTS: . decreased activity tolerance, decreased knowledge of condition, decreased ROM, decreased strength, impaired UE functional use, postural dysfunction, and pain.   ACTIVITY LIMITATIONS: lifting, sleeping, reach over head, and hygiene/grooming   PARTICIPATION LIMITATIONS: cleaning and activities requiring reaching   PERSONAL FACTORS: scapular compensation, thoracic kyphosis are also affecting patient's functional outcome.   REHAB POTENTIAL:  Good  CLINICAL DECISION MAKING: Evolving/moderate complexity  EVALUATION COMPLEXITY: Moderate  GOALS: Goals reviewed with patient? Yes  SHORT TERM GOALS: Target date: 10/25/2024  Pt will be independent with a HEP  Baseline: Goal status: INITIAL  2.  Pt will have decreased Right shoulder pain by 25%  Baseline:  Goal status: INITIAL  3.  Pt will be able to carry a shopping bag with minimal difficulty Baseline: Moderate Goal status: INITIAL  LONG TERM GOALS: Target date: 11/15/2024  Pt will report shoulder pain improved by 50% or greater  Baseline:  Goal status: INITIAL  2. Pt will have no difficulty sleeping  Baseline :mild  INITIAL 3.  Quick dash will improve to no greater than 12%  Baseline:  Goal status: INITIAL  4.  Right shoulder ROM will be improved to within 5 degrees of left shoulder of flexion and abd for improved reaching  Baseline:  Goal status: INITIAL  5.  Pt will be able to sweep and do light vacuuming without increased shoulder pain Baseline:  Goal status: INITIAL  PLAN: PT FREQUENCY: 1-2x/week, starting 2x/ week for 3 weeks then decreasing to 1x/week as able  PT DURATION: 6 weeks  PLANNED INTERVENTIONS: 97164- PT Re-evaluation, 97110-Therapeutic exercises, 97530- Therapeutic activity, 97112- Neuromuscular re-education, 97535- Self Care, 02859- Manual therapy, 97033- Ionotophoresis 4mg /ml Dexamethasone, and Joint mobilization   PLAN FOR NEXT SESSION: Joint mobs, supine AAROM, PROM, review theraband and progress, progress strength and decrease UT compensation   Grayce JINNY Sheldon, PT 10/04/2024, 12:20 PM

## 2024-10-04 ENCOUNTER — Other Ambulatory Visit: Payer: Self-pay

## 2024-10-04 ENCOUNTER — Ambulatory Visit: Attending: Student

## 2024-10-04 ENCOUNTER — Encounter: Payer: Self-pay | Admitting: Radiology

## 2024-10-04 DIAGNOSIS — M25511 Pain in right shoulder: Secondary | ICD-10-CM | POA: Diagnosis not present

## 2024-10-04 DIAGNOSIS — R293 Abnormal posture: Secondary | ICD-10-CM | POA: Diagnosis not present

## 2024-10-04 DIAGNOSIS — M6281 Muscle weakness (generalized): Secondary | ICD-10-CM | POA: Insufficient documentation

## 2024-10-04 DIAGNOSIS — M25611 Stiffness of right shoulder, not elsewhere classified: Secondary | ICD-10-CM | POA: Diagnosis not present

## 2024-10-04 DIAGNOSIS — G8929 Other chronic pain: Secondary | ICD-10-CM | POA: Insufficient documentation

## 2024-10-11 ENCOUNTER — Ambulatory Visit

## 2024-10-11 DIAGNOSIS — M6281 Muscle weakness (generalized): Secondary | ICD-10-CM | POA: Diagnosis not present

## 2024-10-11 DIAGNOSIS — G8929 Other chronic pain: Secondary | ICD-10-CM | POA: Diagnosis not present

## 2024-10-11 DIAGNOSIS — F419 Anxiety disorder, unspecified: Secondary | ICD-10-CM | POA: Diagnosis not present

## 2024-10-11 DIAGNOSIS — M25611 Stiffness of right shoulder, not elsewhere classified: Secondary | ICD-10-CM | POA: Diagnosis not present

## 2024-10-11 DIAGNOSIS — E118 Type 2 diabetes mellitus with unspecified complications: Secondary | ICD-10-CM | POA: Diagnosis not present

## 2024-10-11 DIAGNOSIS — R293 Abnormal posture: Secondary | ICD-10-CM | POA: Diagnosis not present

## 2024-10-11 DIAGNOSIS — E7800 Pure hypercholesterolemia, unspecified: Secondary | ICD-10-CM | POA: Diagnosis not present

## 2024-10-11 DIAGNOSIS — M25511 Pain in right shoulder: Secondary | ICD-10-CM | POA: Diagnosis not present

## 2024-10-11 DIAGNOSIS — Z6830 Body mass index (BMI) 30.0-30.9, adult: Secondary | ICD-10-CM | POA: Diagnosis not present

## 2024-10-11 NOTE — Therapy (Signed)
 OUTPATIENT PHYSICAL THERAPY UPPER EXTREMITY TREATMENT   Patient Name: Joanna Floyd MRN: [id number] DOB:23-Jul-1956, 68 y.o., female Today's Date: 10/11/2024  END OF SESSION:  PT End of Session - 10/11/24 1204     Visit Number 2    Number of Visits 12    Date for Recertification  11/15/24    Authorization Type 1 re-eval and 12 visits    Authorization Time Period 10/04/2024-11/15/2024    Authorization - Visit Number 2    Authorization - Number of Visits 12    PT Start Time 1205    PT Stop Time 1252    PT Time Calculation (min) 47 min    Activity Tolerance Patient tolerated treatment well    Behavior During Therapy [facility] for tasks assessed/performed          Past Medical History:  Diagnosis Date   Varicose veins of both lower extremities    Past Surgical History:  Procedure Laterality Date   varicose veins Bilateral    Patient Active Problem List   Diagnosis Date Noted   Fecal urgency 08/22/2017   Abdominal bloating 08/21/2017   Generalized abdominal pain 08/21/2017   Irritable bowel syndrome with diarrhea 08/21/2017   Distal radius fracture 07/29/2012    PCP:   REFERRING PROVIDER: Leonce Reveal, PA  REFERRING DIAG: Chronic Right shoulder pain  THERAPY DIAG:  Chronic right shoulder pain  Muscle weakness (generalized)  Stiffness of right shoulder, not elsewhere classified  Abnormal posture  Rationale for Evaluation and Treatment: Rehabilitation  ONSET DATE: June 2025 exacerbated again in October  SUBJECTIVE:                                                                                                                                                                                      SUBJECTIVE STATEMENT:  I was only able to get 1 appt per week because your schedule was so full. I am feeling much better, but then again I had the cortisone shot. I have been doing really well.   EVAL My shoulder started hurting worse the first weekend in October  helping my husband in the yard spreading mulch and lifting and handing landscaping bricks . Every little thing I did bothered me. I couldn't sleep and it hurt so bad.  I had another injection on 09/23/2024 and it has been better since then. Still hard to reach with my right hand. I am sleeping OK since the injection. I can't lift heavy things. Still difficult to sweep/ vacuum. I have not returned to my normal activities yet. Pain radiates down the front of my right arm, past the elbow. Sometimes there is tingling in my  hand.   Hand dominance: Right  PERTINENT HISTORY: Pt had been working out with a trainer for exercise but did not notice any pain at the time. Every once in a while she would have some shoulder pain. The last time she saw the trainer and did some overhead activities and pain started after that. Pain started in approximately June of this year. She describes pain at the top of the shoulder that radiates into the right arm mostly to the elbow. She was given a Subacromial injection on 06/22/2024 . After a few days the injection kicked in and pain has improved some but is still moderate. It hurts when she bends her elbow. She was discharged from formal PT on 08/23/2024 with good relied of pain. She had a second injection on 09/23/2024 with good relief  PAIN:  Are you having pain? Yes: NPRS scale: 0/10 at rest,  Pain location: right shoulder Pain description: anterior shoulder to elbow and into forearm, sharp before injection Aggravating factors: lifting percolater, heavier things, Relieving factors: injection, Tylenol , motrin  PM  PRECAUTIONS: pre DM , prior thrombophlebitis LE, IBS,   RED FLAGS: None   WEIGHT BEARING RESTRICTIONS: No  FALLS:  Has patient fallen in last 6 months? 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   PLOF:  Independent  PATIENT GOALS: Decrease pain  NEXT MD VISIT:   OBJECTIVE:  Note: Objective measures were completed at Evaluation unless otherwise noted.  DIAGNOSTIC FINDINGS:  No new Xrays: good joint space  PATIENT SURVEYS :  Quick Dash:  QUICK DASH  Please rate your ability do the following activities in the last week by selecting the number below the appropriate response.   Activities Rating  Open a tight or new jar.  2 = Mild difficulty  Do heavy household chores (e.g., wash walls, floors). 2 = Mild difficulty  Carry a shopping bag or briefcase 3 = Moderate difficulty  Wash your back. 2 = Mild difficulty  Use a knife to cut food. 1 = No difficulty   Recreational activities in which you take some force or impact through your arm, shoulder or hand (e.g., golf, hammering, tennis, etc.). 2 = Mild difficulty  During the past week, to what extent has your arm, shoulder or hand problem interfered with your normal social activities with family, friends, neighbors or groups?  2 = Slightly  During the past week, were you limited in your work or other regular daily activities as a result of your arm, shoulder or hand problem? 3 = Moderately limited  Rate the severity of the following symptoms in the last week: Arm, Shoulder, or hand pain. 2 = Mild  Rate the severity of the following symptoms in the last week: Tingling (pins and needles) in your arm, shoulder or hand. 2 = Mild  During the past week, how much difficulty have you had sleeping because of the pain in your arm, shoulder or hand?  2 = Mild difficulty   (A QuickDASH score may not be calculated if there is greater than 1 missing item.)  Quick Dash Disability/Symptom Score: = 27.27%  Minimally Clinically Important Difference (MCID): 15-20 points  Flavio, F. et al. (2013). Minimally clinically important difference of the disabilities of the arm, shoulder, and hand outcome measures (DASH) and its shortened version (Quick  DASH). Journal of Orthopaedic & Sports Physical Therapy,  44(1), 30-39)   COGNITION: Overall cognitive status: Within functional limits for tasks assessed     SENSATION: WFL  POSTURE: Forward head rounded shoulders, increased thoracic kyphosis     Cervical Screen: negative  UPPER EXTREMITY ROM:   Active ROM Right eval Left eval  Shoulder flexion 129 120  Shoulder extension 54 60  Shoulder abduction 123 compensates 125 compensates  Shoulder adduction    Shoulder internal rotation 53 + 70  Shoulder external rotation 70 83  Elbow flexion    Elbow extension    Wrist flexion    Wrist extension    Wrist ulnar deviation    Wrist radial deviation    Wrist pronation    Wrist supination    (Blank rows = not tested)  UPPER EXTREMITY MMT:  MMT Right eval Left eval  Shoulder flexion 4   Shoulder extension    Shoulder abduction 4-   Shoulder adduction    Shoulder internal rotation 4+   Shoulder external rotation 4   Middle trapezius    Lower trapezius    Elbow flexion 5   Elbow extension 5   Wrist flexion    Wrist extension    Wrist ulnar deviation    Wrist radial deviation    Wrist pronation    Wrist supination    Grip strength (lbs)    (Blank rows = not tested)  SHOULDER SPECIAL TESTS: Impingement tests: Hawkins/Kennedy impingement test: positive  Rotator cuff assessment: Empty can test: positive         PALPATION:  Right supraspinatus mildly. No tenderness to prox biceps mid delt or any other areas                                                                                                                             TREATMENT DATE:   10/11/2024 Standing bilateral scap. Retraction, shoulder ext and bilateral ER with red x 10 UE ranger x10 flexion and scaption Purple 4 D ball stabs x 10 ea Supine wand flexion and scaption x 3 ea  Supine horizontal abd yellow x 10 Supine alphabet 1# x 1 Serratus punch 2 # x 15 Rhythmic stabs at 90 degrees 2 x 20  sec Alternating isometrics IR/ER 2 x 20 sec PROM right shoulder with multiple VC's for pt to relax   10/04/2024 Reviewed impingement and reasons for impingement. Scapular compensation still very present Bilaterally with overhead movements. Discussed importance of using ice(gel pack in toweling) when things first get exacerbated to decrease inflammation. Reviewed proper posture with exercises and pt performed scapular retraction, shoulder extension and bilateral ER all with yellow band x 10 using good breathing technique. Pt to start with yellow and may do 10 + 5 reps with yellow with good posture and never working into pain   PATIENT EDUCATION: Education details: Postural band exs, reasons for impingement, avoid working arms with trainer temporarily Person educated: Patient Education method: Medical Illustrator Education comprehension: verbalized understanding and  returned demonstration  HOME EXERCISE PROGRAM: Postural band;Scapular retraction, shoulder extension, bilateral ER with yellow  ASSESSMENT:  CLINICAL IMPRESSION: Pt had only 1 episode of ild right shoulder pain when doing supine wand flexion, but corrected with depressing scapula. No pain with any other exs but requires intermittent VC's for proper form. Pts shoulder has felt quite good since her injections.    EVAL Patient is a 68 y.o. female who was seen today for physical therapy evaluation and treatment for complaints of right shoulder pain that started in June. She had been working out with a trainer, but did not notice any pain at the time. At her last visit she did overhead triceps ext with her trainer and developed right shoulder pain thereafter. She is s/p a Subacromial Injection on 06/22/2024 and is feeling some better since then.  She had 8 PT visits and was discharged on 08/23/2024 after meeting all of her goals.  She experienced increased shoulder pain in late  October after working in the yard with her  husband spreading mulch and lifting landscape bricks and passing to her husband.She saw the Dr. Again on 09/23/2024 and was given another injection and referred for PT.She presents with right shoulder pain, and with limitations in AROM, and noted UT compensation with overhead activities. She has a mild + impingement test, and empty can test. She has poor round shouldered, forward head posture . Quick dash 27.27 %. She will benefit from  skilled physical therapy to address deficits and to return to a more functional lifestyle.  OBJECTIVE IMPAIRMENTS: . decreased activity tolerance, decreased knowledge of condition, decreased ROM, decreased strength, impaired UE functional use, postural dysfunction, and pain.   ACTIVITY LIMITATIONS: lifting, sleeping, reach over head, and hygiene/grooming   PARTICIPATION LIMITATIONS: cleaning and activities requiring reaching   PERSONAL FACTORS: scapular compensation, thoracic kyphosis are also affecting patient's functional outcome.   REHAB POTENTIAL: Good  CLINICAL DECISION MAKING: Evolving/moderate complexity  EVALUATION COMPLEXITY: Moderate  GOALS: Goals reviewed with patient? Yes  SHORT TERM GOALS: Target date: 10/25/2024  Pt will be independent with a HEP  Baseline: Goal status: INITIAL  2.  Pt will have decreased Right shoulder pain by 25%  Baseline:  Goal status: INITIAL  3.  Pt will be able to carry a shopping bag with minimal difficulty Baseline: Moderate Goal status: INITIAL  LONG TERM GOALS: Target date: 11/15/2024  Pt will report shoulder pain improved by 50% or greater  Baseline:  Goal status: INITIAL  2. Pt will have no difficulty sleeping  Baseline :mild  INITIAL 3.  Quick dash will improve to no greater than 12%  Baseline:  Goal status: INITIAL  4.  Right shoulder ROM will be improved to within 5 degrees of left shoulder of flexion and abd for improved reaching  Baseline:  Goal status: INITIAL  5.  Pt will be able to  sweep and do light vacuuming without increased shoulder pain Baseline:  Goal status: INITIAL  PLAN: PT FREQUENCY: 1-2x/week, starting 2x/ week for 3 weeks then decreasing to 1x/week as able  PT DURATION: 6 weeks  PLANNED INTERVENTIONS: 97164- PT Re-evaluation, 97110-Therapeutic exercises, 97530- Therapeutic activity, 97112- Neuromuscular re-education, 97535- Self Care, 02859- Manual therapy, 97033- Ionotophoresis 4mg /ml Dexamethasone, and Joint mobilization   PLAN FOR NEXT SESSION: Joint mobs, supine AAROM, PROM, review theraband and progress, progress strength and decrease UT compensation   Grayce JINNY Sheldon, PT 10/11/2024, 12:56 PM

## 2024-10-18 ENCOUNTER — Ambulatory Visit

## 2024-10-18 DIAGNOSIS — M6281 Muscle weakness (generalized): Secondary | ICD-10-CM | POA: Diagnosis not present

## 2024-10-18 DIAGNOSIS — G8929 Other chronic pain: Secondary | ICD-10-CM

## 2024-10-18 DIAGNOSIS — M25611 Stiffness of right shoulder, not elsewhere classified: Secondary | ICD-10-CM

## 2024-10-18 DIAGNOSIS — R293 Abnormal posture: Secondary | ICD-10-CM | POA: Diagnosis not present

## 2024-10-18 DIAGNOSIS — M25511 Pain in right shoulder: Secondary | ICD-10-CM | POA: Diagnosis not present

## 2024-10-18 NOTE — Therapy (Signed)
 OUTPATIENT PHYSICAL THERAPY UPPER EXTREMITY TREATMENT   Patient Name: Joanna Floyd MRN: [id number] DOB:11-15-1956, 68 y.o., female Today's Date: 10/18/2024  END OF SESSION:  PT End of Session - 10/18/24 1104     Visit Number 3    Number of Visits 12    Date for Recertification  11/15/24    Authorization Type 1 re-eval and 12 visits    Authorization Time Period 10/04/2024-11/15/2024    Authorization - Visit Number 3    Authorization - Number of Visits 12    PT Start Time 1105    PT Stop Time 1154    PT Time Calculation (min) 49 min    Activity Tolerance Patient tolerated treatment well    Behavior During Therapy [facility] for tasks assessed/performed          Past Medical History:  Diagnosis Date   Varicose veins of both lower extremities    Past Surgical History:  Procedure Laterality Date   varicose veins Bilateral    Patient Active Problem List   Diagnosis Date Noted   Fecal urgency 08/22/2017   Abdominal bloating 08/21/2017   Generalized abdominal pain 08/21/2017   Irritable bowel syndrome with diarrhea 08/21/2017   Distal radius fracture 07/29/2012    PCP:   REFERRING PROVIDER: Leonce Reveal, PA  REFERRING DIAG: Chronic Right shoulder pain  THERAPY DIAG:  Chronic right shoulder pain  Muscle weakness (generalized)  Stiffness of right shoulder, not elsewhere classified  Abnormal posture  Rationale for Evaluation and Treatment: Rehabilitation  ONSET DATE: June 2025 exacerbated again in October  SUBJECTIVE:                                                                                                                                                                                      SUBJECTIVE STATEMENT:  I didn't practice last week because we had our church sale .I lifted boxes and moved things without pain. I did fine after I was here last.   EVAL My shoulder started hurting worse the first weekend in October helping my husband in the yard  spreading mulch and lifting and handing landscaping bricks . Every little thing I did bothered me. I couldn't sleep and it hurt so bad.  I had another injection on 09/23/2024 and it has been better since then. Still hard to reach with my right hand. I am sleeping OK since the injection. I can't lift heavy things. Still difficult to sweep/ vacuum. I have not returned to my normal activities yet. Pain radiates down the front of my right arm, past the elbow. Sometimes there is tingling in my hand.   Hand dominance: Right  PERTINENT  HISTORY: Pt had been working out with a trainer for exercise but did not notice any pain at the time. Every once in a while she would have some shoulder pain. The last time she saw the trainer and did some overhead activities and pain started after that. Pain started in approximately June of this year. She describes pain at the top of the shoulder that radiates into the right arm mostly to the elbow. She was given a Subacromial injection on 06/22/2024 . After a few days the injection kicked in and pain has improved some but is still moderate. It hurts when she bends her elbow. She was discharged from formal PT on 08/23/2024 with good relied of pain. She had a second injection on 09/23/2024 with good relief  PAIN:  Are you having pain? Yes: NPRS scale: 0/10 at rest,  Pain location: right shoulder Pain description: anterior shoulder to elbow and into forearm, sharp before injection Aggravating factors: lifting percolater, heavier things, Relieving factors: injection, Tylenol , motrin  PM  PRECAUTIONS: pre DM , prior thrombophlebitis LE, IBS,   RED FLAGS: None   WEIGHT BEARING RESTRICTIONS: No  FALLS:  Has patient fallen in last 6 months? 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   PLOF: Independent  PATIENT GOALS: Decrease  pain  NEXT MD VISIT:   OBJECTIVE:  Note: Objective measures were completed at Evaluation unless otherwise noted.  DIAGNOSTIC FINDINGS:  No new Xrays: good joint space  PATIENT SURVEYS :  Quick Dash:  QUICK DASH  Please rate your ability do the following activities in the last week by selecting the number below the appropriate response.   Activities Rating  Open a tight or new jar.  2 = Mild difficulty  Do heavy household chores (e.g., wash walls, floors). 2 = Mild difficulty  Carry a shopping bag or briefcase 3 = Moderate difficulty  Wash your back. 2 = Mild difficulty  Use a knife to cut food. 1 = No difficulty   Recreational activities in which you take some force or impact through your arm, shoulder or hand (e.g., golf, hammering, tennis, etc.). 2 = Mild difficulty  During the past week, to what extent has your arm, shoulder or hand problem interfered with your normal social activities with family, friends, neighbors or groups?  2 = Slightly  During the past week, were you limited in your work or other regular daily activities as a result of your arm, shoulder or hand problem? 3 = Moderately limited  Rate the severity of the following symptoms in the last week: Arm, Shoulder, or hand pain. 2 = Mild  Rate the severity of the following symptoms in the last week: Tingling (pins and needles) in your arm, shoulder or hand. 2 = Mild  During the past week, how much difficulty have you had sleeping because of the pain in your arm, shoulder or hand?  2 = Mild difficulty   (A QuickDASH score may not be calculated if there is greater than 1 missing item.)  Quick Dash Disability/Symptom Score: = 27.27%  Minimally Clinically Important Difference (MCID): 15-20 points  Flavio, F. et al. (2013). Minimally clinically important difference of the disabilities of the arm, shoulder, and hand outcome measures (DASH) and its shortened version (Quick DASH). Journal of Orthopaedic & Sports  Physical Therapy, 44(1), 30-39)   COGNITION: Overall cognitive status:  Within functional limits for tasks assessed     SENSATION: WFL  POSTURE: Forward head rounded shoulders, increased thoracic kyphosis     Cervical Screen: negative  UPPER EXTREMITY ROM:   Active ROM Right eval Left eval  Shoulder flexion 129 120  Shoulder extension 54 60  Shoulder abduction 123 compensates 125 compensates  Shoulder adduction    Shoulder internal rotation 53 + 70  Shoulder external rotation 70 83  Elbow flexion    Elbow extension    Wrist flexion    Wrist extension    Wrist ulnar deviation    Wrist radial deviation    Wrist pronation    Wrist supination    (Blank rows = not tested)  UPPER EXTREMITY MMT:  MMT Right eval Left eval  Shoulder flexion 4   Shoulder extension    Shoulder abduction 4-   Shoulder adduction    Shoulder internal rotation 4+   Shoulder external rotation 4   Middle trapezius    Lower trapezius    Elbow flexion 5   Elbow extension 5   Wrist flexion    Wrist extension    Wrist ulnar deviation    Wrist radial deviation    Wrist pronation    Wrist supination    Grip strength (lbs)    (Blank rows = not tested)  SHOULDER SPECIAL TESTS: Impingement tests: Hawkins/Kennedy impingement test: positive  Rotator cuff assessment: Empty can test: positive         PALPATION:  Right supraspinatus mildly. No tenderness to prox biceps mid delt or any other areas                                                                                                                             TREATMENT DATE:   10/18/2024 Thoracic extension over towel roll x 10, Standing bilateral scap. Retraction, shoulder ext and bilateral ER with red x 10 UE ranger x15 flexion and scaption Red weighted ball 4 D ball stabs x 15 ea Red elbow flexion B x 15 Right elbow ext red band x 15 Bilateral shoulder horizontal abd red x 15 Supine sword thumb up yellow x 10 ea Serratus  punch 3# x 15, Then B x 10 Attempted PROM but pt could not relax well so did not force Supine wand scaption x 5 10/11/2024 Standing bilateral scap. Retraction, shoulder ext and bilateral ER with red x 10 UE ranger x10 flexion and scaption Purple 4 D ball stabs x 10 ea Supine wand flexion and scaption x 3 ea  Supine horizontal abd yellow x 10 Supine alphabet 1# x 1 Serratus punch 2 # x 15 Rhythmic stabs at 90 degrees 2 x 20 sec Alternating isometrics IR/ER 2 x 20 sec PROM right shoulder with multiple VC's for pt to relax   10/04/2024 Reviewed impingement and reasons for impingement. Scapular compensation still very present Bilaterally with overhead movements. Discussed importance of using ice(gel pack in toweling) when things first  get exacerbated to decrease inflammation. Reviewed proper posture with exercises and pt performed scapular retraction, shoulder extension and bilateral ER all with yellow band x 10 using good breathing technique. Pt to start with yellow and may do 10 + 5 reps with yellow with good posture and never working into pain   PATIENT EDUCATION: Education details: Postural band exs, reasons for impingement, avoid working arms with trainer temporarily Person educated: Patient Education method: Medical Illustrator Education comprehension: verbalized understanding and returned demonstration  HOME EXERCISE PROGRAM: Postural band;Scapular retraction, shoulder extension, bilateral ER with yellow  ASSESSMENT:  CLINICAL IMPRESSION:  Pt had mild shoulder discomfort with shoulder ranger today, and did not relax for PROM, but did well with supine wand exs to stretch after rx. Abl to progress reps/resistance on selected exs  EVAL Patient is a 68 y.o. female who was seen today for physical therapy evaluation and treatment for complaints of right shoulder pain that started in June. She had been working out with a trainer, but did not notice any pain at the time. At  her last visit she did overhead triceps ext with her trainer and developed right shoulder pain thereafter. She is s/p a Subacromial Injection on 06/22/2024 and is feeling some better since then.  She had 8 PT visits and was discharged on 08/23/2024 after meeting all of her goals.  She experienced increased shoulder pain in late  October after working in the yard with her husband spreading mulch and lifting landscape bricks and passing to her husband.She saw the Dr. Again on 09/23/2024 and was given another injection and referred for PT.She presents with right shoulder pain, and with limitations in AROM, and noted UT compensation with overhead activities. She has a mild + impingement test, and empty can test. She has poor round shouldered, forward head posture . Quick dash 27.27 %. She will benefit from  skilled physical therapy to address deficits and to return to a more functional lifestyle.  OBJECTIVE IMPAIRMENTS: . decreased activity tolerance, decreased knowledge of condition, decreased ROM, decreased strength, impaired UE functional use, postural dysfunction, and pain.   ACTIVITY LIMITATIONS: lifting, sleeping, reach over head, and hygiene/grooming   PARTICIPATION LIMITATIONS: cleaning and activities requiring reaching   PERSONAL FACTORS: scapular compensation, thoracic kyphosis are also affecting patient's functional outcome.   REHAB POTENTIAL: Good  CLINICAL DECISION MAKING: Evolving/moderate complexity  EVALUATION COMPLEXITY: Moderate  GOALS: Goals reviewed with patient? Yes  SHORT TERM GOALS: Target date: 10/25/2024  Pt will be independent with a HEP  Baseline: Goal status: INITIAL  2.  Pt will have decreased Right shoulder pain by 25%  Baseline:  Goal status: INITIAL  3.  Pt will be able to carry a shopping bag with minimal difficulty Baseline: Moderate Goal status: INITIAL  LONG TERM GOALS: Target date: 11/15/2024  Pt will report shoulder pain improved by 50% or greater   Baseline:  Goal status: INITIAL  2. Pt will have no difficulty sleeping  Baseline :mild  INITIAL 3.  Quick dash will improve to no greater than 12%  Baseline:  Goal status: INITIAL  4.  Right shoulder ROM will be improved to within 5 degrees of left shoulder of flexion and abd for improved reaching  Baseline:  Goal status: INITIAL  5.  Pt will be able to sweep and do light vacuuming without increased shoulder pain Baseline:  Goal status: INITIAL  PLAN: PT FREQUENCY: 1-2x/week, starting 2x/ week for 3 weeks then decreasing to 1x/week as able  PT DURATION:  6 weeks  PLANNED INTERVENTIONS: 97164- PT Re-evaluation, 97110-Therapeutic exercises, 97530- Therapeutic activity, 97112- Neuromuscular re-education, 97535- Self Care, [id number]- Manual therapy, [phone]- Ionotophoresis 4mg /ml Dexamethasone, and Joint mobilization   PLAN FOR NEXT SESSION: Joint mobs, supine AAROM, PROM, review theraband and progress, progress strength and decrease UT compensation   Grayce JINNY Sheldon, PT 10/18/2024, 11:58 AM

## 2024-10-21 ENCOUNTER — Ambulatory Visit

## 2024-10-21 DIAGNOSIS — M25611 Stiffness of right shoulder, not elsewhere classified: Secondary | ICD-10-CM | POA: Diagnosis not present

## 2024-10-21 DIAGNOSIS — M25511 Pain in right shoulder: Secondary | ICD-10-CM | POA: Diagnosis not present

## 2024-10-21 DIAGNOSIS — R293 Abnormal posture: Secondary | ICD-10-CM

## 2024-10-21 DIAGNOSIS — M6281 Muscle weakness (generalized): Secondary | ICD-10-CM | POA: Diagnosis not present

## 2024-10-21 DIAGNOSIS — G8929 Other chronic pain: Secondary | ICD-10-CM

## 2024-10-21 NOTE — Therapy (Signed)
 OUTPATIENT PHYSICAL THERAPY UPPER EXTREMITY TREATMENT   Patient Name: Joanna Floyd MRN: [id number] DOB:1956-07-31, 68 y.o., female Today's Date: 10/21/2024  END OF SESSION:  PT End of Session - 10/21/24 1203     Visit Number 4    Number of Visits 12    Date for Recertification  11/15/24    Authorization Type 1 re-eval and 12 visits    Authorization Time Period 10/04/2024-11/15/2024    Authorization - Visit Number 4    Authorization - Number of Visits 12    PT Start Time 1206    PT Stop Time 1304    PT Time Calculation (min) 58 min    Activity Tolerance Patient tolerated treatment well    Behavior During Therapy [facility] for tasks assessed/performed          Past Medical History:  Diagnosis Date   Varicose veins of both lower extremities    Past Surgical History:  Procedure Laterality Date   varicose veins Bilateral    Patient Active Problem List   Diagnosis Date Noted   Fecal urgency 08/22/2017   Abdominal bloating 08/21/2017   Generalized abdominal pain 08/21/2017   Irritable bowel syndrome with diarrhea 08/21/2017   Distal radius fracture 07/29/2012    PCP:   REFERRING PROVIDER: Leonce Reveal, PA  REFERRING DIAG: Chronic Right shoulder pain  THERAPY DIAG:  Chronic right shoulder pain  Muscle weakness (generalized)  Stiffness of right shoulder, not elsewhere classified  Abnormal posture  Rationale for Evaluation and Treatment: Rehabilitation  ONSET DATE: June 2025 exacerbated again in October  SUBJECTIVE:                                                                                                                                                                                      SUBJECTIVE STATEMENT:  I felt good after last visit. No lingering pain.   EVAL My shoulder started hurting worse the first weekend in October helping my husband in the yard spreading mulch and lifting and handing landscaping bricks . Every little thing I did bothered  me. I couldn't sleep and it hurt so bad.  I had another injection on 09/23/2024 and it has been better since then. Still hard to reach with my right hand. I am sleeping OK since the injection. I can't lift heavy things. Still difficult to sweep/ vacuum. I have not returned to my normal activities yet. Pain radiates down the front of my right arm, past the elbow. Sometimes there is tingling in my hand.   Hand dominance: Right  PERTINENT HISTORY: Pt had been working out with a trainer for exercise but did not notice any pain at  the time. Every once in a while she would have some shoulder pain. The last time she saw the trainer and did some overhead activities and pain started after that. Pain started in approximately June of this year. She describes pain at the top of the shoulder that radiates into the right arm mostly to the elbow. She was given a Subacromial injection on 06/22/2024 . After a few days the injection kicked in and pain has improved some but is still moderate. It hurts when she bends her elbow. She was discharged from formal PT on 08/23/2024 with good relied of pain. She had a second injection on 09/23/2024 with good relief  PAIN:  Are you having pain? Yes: NPRS scale: 0/10 at rest,  Pain location: right shoulder Pain description: anterior shoulder to elbow and into forearm, sharp before injection Aggravating factors: lifting percolater, heavier things, Relieving factors: injection, Tylenol , motrin  PM  PRECAUTIONS: pre DM , prior thrombophlebitis LE, IBS,   RED FLAGS: None   WEIGHT BEARING RESTRICTIONS: No  FALLS:  Has patient fallen in last 6 months? 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   PLOF: Independent  PATIENT GOALS: Decrease pain  NEXT MD VISIT:   OBJECTIVE:  Note: Objective measures were completed at Evaluation  unless otherwise noted.  DIAGNOSTIC FINDINGS:  No new Xrays: good joint space  PATIENT SURVEYS :  Quick Dash:  QUICK DASH  Please rate your ability do the following activities in the last week by selecting the number below the appropriate response.   Activities Rating  Open a tight or new jar.  2 = Mild difficulty  Do heavy household chores (e.g., wash walls, floors). 2 = Mild difficulty  Carry a shopping bag or briefcase 3 = Moderate difficulty  Wash your back. 2 = Mild difficulty  Use a knife to cut food. 1 = No difficulty   Recreational activities in which you take some force or impact through your arm, shoulder or hand (e.g., golf, hammering, tennis, etc.). 2 = Mild difficulty  During the past week, to what extent has your arm, shoulder or hand problem interfered with your normal social activities with family, friends, neighbors or groups?  2 = Slightly  During the past week, were you limited in your work or other regular daily activities as a result of your arm, shoulder or hand problem? 3 = Moderately limited  Rate the severity of the following symptoms in the last week: Arm, Shoulder, or hand pain. 2 = Mild  Rate the severity of the following symptoms in the last week: Tingling (pins and needles) in your arm, shoulder or hand. 2 = Mild  During the past week, how much difficulty have you had sleeping because of the pain in your arm, shoulder or hand?  2 = Mild difficulty   (A QuickDASH score may not be calculated if there is greater than 1 missing item.)  Quick Dash Disability/Symptom Score: = 27.27%  Minimally Clinically Important Difference (MCID): 15-20 points  Flavio, F. et al. (2013). Minimally clinically important difference of the disabilities of the arm, shoulder, and hand outcome measures (DASH) and its shortened version (Quick DASH). Journal of Orthopaedic & Sports Physical Therapy, 44(1), 30-39)   COGNITION: Overall cognitive status: Within functional limits  for tasks assessed     SENSATION: WFL  POSTURE: Forward head rounded shoulders,  increased thoracic kyphosis     Cervical Screen: negative  UPPER EXTREMITY ROM:   Active ROM Right eval Left eval  Shoulder flexion 129 120  Shoulder extension 54 60  Shoulder abduction 123 compensates 125 compensates  Shoulder adduction    Shoulder internal rotation 53 + 70  Shoulder external rotation 70 83  Elbow flexion    Elbow extension    Wrist flexion    Wrist extension    Wrist ulnar deviation    Wrist radial deviation    Wrist pronation    Wrist supination    (Blank rows = not tested)  UPPER EXTREMITY MMT:  MMT Right eval Left eval  Shoulder flexion 4   Shoulder extension    Shoulder abduction 4-   Shoulder adduction    Shoulder internal rotation 4+   Shoulder external rotation 4   Middle trapezius    Lower trapezius    Elbow flexion 5   Elbow extension 5   Wrist flexion    Wrist extension    Wrist ulnar deviation    Wrist radial deviation    Wrist pronation    Wrist supination    Grip strength (lbs)    (Blank rows = not tested)  SHOULDER SPECIAL TESTS: Impingement tests: Hawkins/Kennedy impingement test: positive  Rotator cuff assessment: Empty can test: positive         PALPATION:  Right supraspinatus mildly. No tenderness to prox biceps mid delt or any other areas                                                                                                                             TREATMENT DATE:  10/21/2024  Standing bilateral elbow flexion red x 15 Elbow ext red x 15 Scapular retraction red x 15, Ext red x 15 Bilateral ER x 10 red Wall pushups with a plus x 15 4 D red ball stabs x 15 Shoulder ranger shoulder flexion and scaption x 10 Serratus punch 4 # x 15  Supine alphabet x 1, 3 lbs Supine horizontal abd x 10 , held due to hand cramp Massaged right hand to get cramp to release Supine wand flexion and scaption x 5 Updated HEP with  biceps curls and elbow extension with band Answered pt question about dead lifts and if OK to do with her shoulder; explained bigger concern of watching form with back 10/18/2024 Thoracic extension over towel roll x 10, Standing bilateral scap. Retraction, shoulder ext and bilateral ER with red x 10 UE ranger x15 flexion and scaption Red weighted ball 4 D ball stabs x 15 ea Red elbow flexion B x 15 Right elbow ext red band x 15 Bilateral shoulder horizontal abd red x 15 Supine sword thumb up yellow x 10 ea Serratus punch 3# x 15, Then B x 10 Attempted PROM but pt could not relax well so did not force Supine wand scaption x 5 10/11/2024 Standing bilateral scap. Retraction, shoulder ext  and bilateral ER with red x 10 UE ranger x10 flexion and scaption Purple 4 D ball stabs x 10 ea Supine wand flexion and scaption x 3 ea  Supine horizontal abd yellow x 10 Supine alphabet 1# x 1 Serratus punch 2 # x 15 Rhythmic stabs at 90 degrees 2 x 20 sec Alternating isometrics IR/ER 2 x 20 sec PROM right shoulder with multiple VC's for pt to relax   10/04/2024 Reviewed impingement and reasons for impingement. Scapular compensation still very present Bilaterally with overhead movements. Discussed importance of using ice(gel pack in toweling) when things first get exacerbated to decrease inflammation. Reviewed proper posture with exercises and pt performed scapular retraction, shoulder extension and bilateral ER all with yellow band x 10 using good breathing technique. Pt to start with yellow and may do 10 + 5 reps with yellow with good posture and never working into pain   PATIENT EDUCATION: Access Code: [email or web address] URL: [email or web address] Date: 10/21/2024 Prepared by: Grayce Sheldon  Exercises - Standing Bicep Curls with Resistance  - 1 x daily - 7 x weekly - 10-15 reps - Standing Elbow Extension with Self-Anchored Resistance  - 1 x daily - 7 x weekly - 1 sets - 10-15  reps Education details: Postural band exs, reasons for impingement, avoid working arms with trainer temporarily Person educated: Patient Education method: Medical Illustrator Education comprehension: verbalized understanding and returned demonstration  HOME EXERCISE PROGRAM: Postural band;Scapular retraction, shoulder extension, bilateral ER with yellow, Biceps curls triceps with red  ASSESSMENT:  CLINICAL IMPRESSION:  Pt had mild shoulder discomfort with initial performance of elbow extension, but improved when reviewed a second time. No pain with any other exercises and able to increase reps on selected exercises. Decreasing to 1x/week next week.  EVAL Patient is a 68 y.o. female who was seen today for physical therapy evaluation and treatment for complaints of right shoulder pain that started in June. She had been working out with a trainer, but did not notice any pain at the time. At her last visit she did overhead triceps ext with her trainer and developed right shoulder pain thereafter. She is s/p a Subacromial Injection on 06/22/2024 and is feeling some better since then.  She had 8 PT visits and was discharged on 08/23/2024 after meeting all of her goals.  She experienced increased shoulder pain in late  October after working in the yard with her husband spreading mulch and lifting landscape bricks and passing to her husband.She saw the Dr. Again on 09/23/2024 and was given another injection and referred for PT.She presents with right shoulder pain, and with limitations in AROM, and noted UT compensation with overhead activities. She has a mild + impingement test, and empty can test. She has poor round shouldered, forward head posture . Quick dash 27.27 %. She will benefit from  skilled physical therapy to address deficits and to return to a more functional lifestyle.  OBJECTIVE IMPAIRMENTS: . decreased activity tolerance, decreased knowledge of condition, decreased ROM, decreased  strength, impaired UE functional use, postural dysfunction, and pain.   ACTIVITY LIMITATIONS: lifting, sleeping, reach over head, and hygiene/grooming   PARTICIPATION LIMITATIONS: cleaning and activities requiring reaching   PERSONAL FACTORS: scapular compensation, thoracic kyphosis are also affecting patient's functional outcome.   REHAB POTENTIAL: Good  CLINICAL DECISION MAKING: Evolving/moderate complexity  EVALUATION COMPLEXITY: Moderate  GOALS: Goals reviewed with patient? Yes  SHORT TERM GOALS: Target date: 10/25/2024  Pt will be independent with a HEP  Baseline: Goal status:  INITIAL  2.  Pt will have decreased Right shoulder pain by 25%  Baseline:  Goal status: INITIAL  3.  Pt will be able to carry a shopping bag with minimal difficulty Baseline: Moderate Goal status: INITIAL  LONG TERM GOALS: Target date: 11/15/2024  Pt will report shoulder pain improved by 50% or greater  Baseline:  Goal status: INITIAL  2. Pt will have no difficulty sleeping  Baseline :mild  INITIAL 3.  Quick dash will improve to no greater than 12%  Baseline:  Goal status: INITIAL  4.  Right shoulder ROM will be improved to within 5 degrees of left shoulder of flexion and abd for improved reaching  Baseline:  Goal status: INITIAL  5.  Pt will be able to sweep and do light vacuuming without increased shoulder pain Baseline:  Goal status: INITIAL  PLAN: PT FREQUENCY: 1-2x/week, starting 2x/ week for 3 weeks then decreasing to 1x/week as able  PT DURATION: 6 weeks  PLANNED INTERVENTIONS: 97164- PT Re-evaluation, 97110-Therapeutic exercises, 97530- Therapeutic activity, 97112- Neuromuscular re-education, 97535- Self Care, [id number]- Manual therapy, 97033- Ionotophoresis 4mg /ml Dexamethasone, and Joint mobilization   PLAN FOR NEXT SESSION: Joint mobs, supine AAROM, PROM, review theraband and progress, progress strength and decrease UT compensation   [facility], PT 10/21/2024,  1:10 PM

## 2024-10-25 ENCOUNTER — Ambulatory Visit

## 2024-10-25 DIAGNOSIS — M25611 Stiffness of right shoulder, not elsewhere classified: Secondary | ICD-10-CM

## 2024-10-25 DIAGNOSIS — M6281 Muscle weakness (generalized): Secondary | ICD-10-CM | POA: Diagnosis not present

## 2024-10-25 DIAGNOSIS — M25511 Pain in right shoulder: Secondary | ICD-10-CM | POA: Diagnosis not present

## 2024-10-25 DIAGNOSIS — G8929 Other chronic pain: Secondary | ICD-10-CM | POA: Diagnosis not present

## 2024-10-25 DIAGNOSIS — R293 Abnormal posture: Secondary | ICD-10-CM

## 2024-10-25 NOTE — Therapy (Signed)
 OUTPATIENT PHYSICAL THERAPY UPPER EXTREMITY TREATMENT   Patient Name: Joanna Floyd MRN: [id number] DOB:01-Sep-1956, 68 y.o., female Today's Date: 10/25/2024  END OF SESSION:  PT End of Session - 10/25/24 1102     Visit Number 5    Number of Visits 12    Date for Recertification  11/15/24    Authorization Type 1 re-eval and 12 visits    Authorization Time Period 10/04/2024-11/15/2024    Authorization - Visit Number 5    Authorization - Number of Visits 12    PT Start Time 1103    PT Stop Time 1156    PT Time Calculation (min) 53 min    Activity Tolerance Patient tolerated treatment well    Behavior During Therapy [facility] for tasks assessed/performed          Past Medical History:  Diagnosis Date   Varicose veins of both lower extremities    Past Surgical History:  Procedure Laterality Date   varicose veins Bilateral    Patient Active Problem List   Diagnosis Date Noted   Fecal urgency 08/22/2017   Abdominal bloating 08/21/2017   Generalized abdominal pain 08/21/2017   Irritable bowel syndrome with diarrhea 08/21/2017   Distal radius fracture 07/29/2012    PCP:   REFERRING PROVIDER: Leonce Reveal, PA  REFERRING DIAG: Chronic Right shoulder pain  THERAPY DIAG:  Chronic right shoulder pain  Muscle weakness (generalized)  Stiffness of right shoulder, not elsewhere classified  Abnormal posture  Rationale for Evaluation and Treatment: Rehabilitation  ONSET DATE: June 2025 exacerbated again in October  SUBJECTIVE:                                                                                                                                                                                      SUBJECTIVE STATEMENT:  Did well after last visit. I did a lot of work on Saturday, but I felt good. I scrubbed the shower door, and did other household things and I had no pain.   EVAL My shoulder started hurting worse the first weekend in October helping my husband in  the yard spreading mulch and lifting and handing landscaping bricks . Every little thing I did bothered me. I couldn't sleep and it hurt so bad.  I had another injection on 09/23/2024 and it has been better since then. Still hard to reach with my right hand. I am sleeping OK since the injection. I can't lift heavy things. Still difficult to sweep/ vacuum. I have not returned to my normal activities yet. Pain radiates down the front of my right arm, past the elbow. Sometimes there is tingling in my hand.  Hand dominance: Right  PERTINENT HISTORY: Pt had been working out with a trainer for exercise but did not notice any pain at the time. Every once in a while she would have some shoulder pain. The last time she saw the trainer and did some overhead activities and pain started after that. Pain started in approximately June of this year. She describes pain at the top of the shoulder that radiates into the right arm mostly to the elbow. She was given a Subacromial injection on 06/22/2024 . After a few days the injection kicked in and pain has improved some but is still moderate. It hurts when she bends her elbow. She was discharged from formal PT on 08/23/2024 with good relied of pain. She had a second injection on 09/23/2024 with good relief  PAIN:  Are you having pain? Yes: NPRS scale: 0/10 at rest,  Pain location: right shoulder Pain description: anterior shoulder to elbow and into forearm, sharp before injection Aggravating factors: lifting percolater, heavier things, Relieving factors: injection, Tylenol , motrin  PM  PRECAUTIONS: pre DM , prior thrombophlebitis LE, IBS,   RED FLAGS: None   WEIGHT BEARING RESTRICTIONS: No  FALLS:  Has patient fallen in last 6 months? 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   PLOF: Independent  PATIENT GOALS:  Decrease pain  NEXT MD VISIT:   OBJECTIVE:  Note: Objective measures were completed at Evaluation unless otherwise noted.  DIAGNOSTIC FINDINGS:  No new Xrays: good joint space  PATIENT SURVEYS :  Quick Dash:  QUICK DASH  Please rate your ability do the following activities in the last week by selecting the number below the appropriate response.   Activities Rating  Open a tight or new jar.  2 = Mild difficulty  Do heavy household chores (e.g., wash walls, floors). 2 = Mild difficulty  Carry a shopping bag or briefcase 3 = Moderate difficulty  Wash your back. 2 = Mild difficulty  Use a knife to cut food. 1 = No difficulty   Recreational activities in which you take some force or impact through your arm, shoulder or hand (e.g., golf, hammering, tennis, etc.). 2 = Mild difficulty  During the past week, to what extent has your arm, shoulder or hand problem interfered with your normal social activities with family, friends, neighbors or groups?  2 = Slightly  During the past week, were you limited in your work or other regular daily activities as a result of your arm, shoulder or hand problem? 3 = Moderately limited  Rate the severity of the following symptoms in the last week: Arm, Shoulder, or hand pain. 2 = Mild  Rate the severity of the following symptoms in the last week: Tingling (pins and needles) in your arm, shoulder or hand. 2 = Mild  During the past week, how much difficulty have you had sleeping because of the pain in your arm, shoulder or hand?  2 = Mild difficulty   (A QuickDASH score may not be calculated if there is greater than 1 missing item.)  Quick Dash Disability/Symptom Score: = 27.27%  Minimally Clinically Important Difference (MCID): 15-20 points  Flavio, F. et al. (2013). Minimally clinically important difference of the disabilities of the arm, shoulder, and hand outcome measures (DASH) and its shortened version (Quick DASH). Journal of Orthopaedic &  Sports Physical Therapy, 44(1), 30-39)  COGNITION: Overall cognitive status: Within functional limits for tasks assessed     SENSATION: WFL  POSTURE: Forward head rounded shoulders, increased thoracic kyphosis     Cervical Screen: negative  UPPER EXTREMITY ROM:   Active ROM Right eval Left eval  Shoulder flexion 129 120  Shoulder extension 54 60  Shoulder abduction 123 compensates 125 compensates  Shoulder adduction    Shoulder internal rotation 53 + 70  Shoulder external rotation 70 83  Elbow flexion    Elbow extension    Wrist flexion    Wrist extension    Wrist ulnar deviation    Wrist radial deviation    Wrist pronation    Wrist supination    (Blank rows = not tested)  UPPER EXTREMITY MMT:  MMT Right eval Left eval  Shoulder flexion 4   Shoulder extension    Shoulder abduction 4-   Shoulder adduction    Shoulder internal rotation 4+   Shoulder external rotation 4   Middle trapezius    Lower trapezius    Elbow flexion 5   Elbow extension 5   Wrist flexion    Wrist extension    Wrist ulnar deviation    Wrist radial deviation    Wrist pronation    Wrist supination    Grip strength (lbs)    (Blank rows = not tested)  SHOULDER SPECIAL TESTS: Impingement tests: Hawkins/Kennedy impingement test: positive  Rotator cuff assessment: Empty can test: positive         PALPATION:  Right supraspinatus mildly. No tenderness to prox biceps mid delt or any other areas                                                                                                                             TREATMENT DATE:   10/25/2024 UBE x 3 min, lev 1, 1:30 ea direction Wall pushups with a plus x 15 Shoulder ranger shoulder flexion and scaption x 10 4 D red ball stabs x 15 Reach to eye level shelf x 10 with 1 # wt Bilateral elbow flexion with red band x 15 Elbow extension red x 10 Serratus punch 4 # 2 x 10 Supine alphabet x 1, 3 lbs Sword with yellow band x 10  B Supine wand flexion and scaption x 5 ea  10/21/2024  Standing bilateral elbow flexion red x 15 Elbow ext red x 15 Scapular retraction red x 15, Ext red x 15 Bilateral ER x 10 red Wall pushups with a plus x 15 4 D red ball stabs x 15 Shoulder ranger shoulder flexion and scaption x 10 Serratus punch 4 # x 15  Supine alphabet x 1, 3 lbs Supine horizontal abd x 10 , held due to hand cramp Massaged right hand to get cramp to release Supine wand flexion and scaption x 5 Updated HEP with biceps curls and elbow extension with band Answered pt question about dead lifts and if OK to do with  her shoulder; explained bigger concern of watching form with back 10/18/2024 Thoracic extension over towel roll x 10, Standing bilateral scap. Retraction, shoulder ext and bilateral ER with red x 10 UE ranger x15 flexion and scaption Red weighted ball 4 D ball stabs x 15 ea Red elbow flexion B x 15 Right elbow ext red band x 15 Bilateral shoulder horizontal abd red x 15 Supine sword thumb up yellow x 10 ea Serratus punch 3# x 15, Then B x 10 Attempted PROM but pt could not relax well so did not force Supine wand scaption x 5 10/11/2024 Standing bilateral scap. Retraction, shoulder ext and bilateral ER with red x 10 UE ranger x10 flexion and scaption Purple 4 D ball stabs x 10 ea Supine wand flexion and scaption x 3 ea  Supine horizontal abd yellow x 10 Supine alphabet 1# x 1 Serratus punch 2 # x 15 Rhythmic stabs at 90 degrees 2 x 20 sec Alternating isometrics IR/ER 2 x 20 sec PROM right shoulder with multiple VC's for pt to relax   10/04/2024 Reviewed impingement and reasons for impingement. Scapular compensation still very present Bilaterally with overhead movements. Discussed importance of using ice(gel pack in toweling) when things first get exacerbated to decrease inflammation. Reviewed proper posture with exercises and pt performed scapular retraction, shoulder extension and bilateral ER  all with yellow band x 10 using good breathing technique. Pt to start with yellow and may do 10 + 5 reps with yellow with good posture and never working into pain   PATIENT EDUCATION: Access Code: [email or web address] URL: [email or web address] Date: 10/21/2024 Prepared by: Grayce Sheldon  Exercises - Standing Bicep Curls with Resistance  - 1 x daily - 7 x weekly - 10-15 reps - Standing Elbow Extension with Self-Anchored Resistance  - 1 x daily - 7 x weekly - 1 sets - 10-15 reps Education details: Postural band exs, reasons for impingement, avoid working arms with trainer temporarily Person educated: Patient Education method: Medical Illustrator Education comprehension: verbalized understanding and returned demonstration  HOME EXERCISE PROGRAM: Postural band;Scapular retraction, shoulder extension, bilateral ER with yellow, Biceps curls triceps with red  ASSESSMENT:  CLINICAL IMPRESSION:  Initiated UBE and reach to eye level shelf today, and continued strength and stability exercises. No pain with any activity today, but does still require VC to prevent compensation.  EVAL Patient is a 68 y.o. female who was seen today for physical therapy evaluation and treatment for complaints of right shoulder pain that started in June. She had been working out with a trainer, but did not notice any pain at the time. At her last visit she did overhead triceps ext with her trainer and developed right shoulder pain thereafter. She is s/p a Subacromial Injection on 06/22/2024 and is feeling some better since then.  She had 8 PT visits and was discharged on 08/23/2024 after meeting all of her goals.  She experienced increased shoulder pain in late  October after working in the yard with her husband spreading mulch and lifting landscape bricks and passing to her husband.She saw the Dr. Again on 09/23/2024 and was given another injection and referred for PT.She presents with right shoulder pain, and  with limitations in AROM, and noted UT compensation with overhead activities. She has a mild + impingement test, and empty can test. She has poor round shouldered, forward head posture . Quick dash 27.27 %. She will benefit from  skilled physical therapy to address deficits and to return to a more  functional lifestyle.  OBJECTIVE IMPAIRMENTS: . decreased activity tolerance, decreased knowledge of condition, decreased ROM, decreased strength, impaired UE functional use, postural dysfunction, and pain.   ACTIVITY LIMITATIONS: lifting, sleeping, reach over head, and hygiene/grooming   PARTICIPATION LIMITATIONS: cleaning and activities requiring reaching   PERSONAL FACTORS: scapular compensation, thoracic kyphosis are also affecting patient's functional outcome.   REHAB POTENTIAL: Good  CLINICAL DECISION MAKING: Evolving/moderate complexity  EVALUATION COMPLEXITY: Moderate  GOALS: Goals reviewed with patient? Yes  SHORT TERM GOALS: Target date: 10/25/2024  Pt will be independent with a HEP  Baseline: Goal status: MET 2.  Pt will have decreased Right shoulder pain by 25%  Baseline:  Goal status: INITIAL  3.  Pt will be able to carry a shopping bag with minimal difficulty Baseline: Moderate Goal status: INITIAL  LONG TERM GOALS: Target date: 11/15/2024  Pt will report shoulder pain improved by 50% or greater  Baseline:  Goal status: INITIAL  2. Pt will have no difficulty sleeping  Baseline :mild  INITIAL 3.  Quick dash will improve to no greater than 12%  Baseline:  Goal status: INITIAL  4.  Right shoulder ROM will be improved to within 5 degrees of left shoulder of flexion and abd for improved reaching  Baseline:  Goal status: INITIAL  5.  Pt will be able to sweep and do light vacuuming without increased shoulder pain Baseline:  Goal status: INITIAL  PLAN: PT FREQUENCY: 1-2x/week, starting 2x/ week for 3 weeks then decreasing to 1x/week as able  PT DURATION: 6  weeks  PLANNED INTERVENTIONS: 97164- PT Re-evaluation, 97110-Therapeutic exercises, 97530- Therapeutic activity, 97112- Neuromuscular re-education, 97535- Self Care, [id number]- Manual therapy, 97033- Ionotophoresis 4mg /ml Dexamethasone, and Joint mobilization   PLAN FOR NEXT SESSION:goals, UBE Joint mobs, supine AAROM, PROM, review theraband and progress, progress strength and decrease UT compensation   Grayce JINNY Sheldon, PT 10/25/2024, 11:59 AM

## 2024-11-01 ENCOUNTER — Ambulatory Visit

## 2024-11-01 DIAGNOSIS — M25611 Stiffness of right shoulder, not elsewhere classified: Secondary | ICD-10-CM | POA: Diagnosis present

## 2024-11-01 DIAGNOSIS — G8929 Other chronic pain: Secondary | ICD-10-CM | POA: Diagnosis present

## 2024-11-01 DIAGNOSIS — M6281 Muscle weakness (generalized): Secondary | ICD-10-CM | POA: Insufficient documentation

## 2024-11-01 DIAGNOSIS — R293 Abnormal posture: Secondary | ICD-10-CM | POA: Diagnosis present

## 2024-11-01 DIAGNOSIS — M25511 Pain in right shoulder: Secondary | ICD-10-CM | POA: Insufficient documentation

## 2024-11-01 NOTE — Therapy (Signed)
 OUTPATIENT PHYSICAL THERAPY UPPER EXTREMITY TREATMENT   Patient Name: Joanna Floyd MRN: [id number] DOB:28-Feb-1956, 68 y.o., female Today's Date: 11/01/2024  END OF SESSION:  PT End of Session - 11/01/24 1107     Visit Number 6    Number of Visits 12    Date for Recertification  11/15/24    Authorization Type 1 re-eval and 12 visits    Authorization Time Period 10/04/2024-11/15/2024    PT Start Time 1107    PT Stop Time 1150    PT Time Calculation (min) 43 min    Activity Tolerance Patient tolerated treatment well    Behavior During Therapy [facility] for tasks assessed/performed          Past Medical History:  Diagnosis Date   Varicose veins of both lower extremities    Past Surgical History:  Procedure Laterality Date   varicose veins Bilateral    Patient Active Problem List   Diagnosis Date Noted   Fecal urgency 08/22/2017   Abdominal bloating 08/21/2017   Generalized abdominal pain 08/21/2017   Irritable bowel syndrome with diarrhea 08/21/2017   Distal radius fracture 07/29/2012    PCP:   REFERRING PROVIDER: Leonce Reveal, PA  REFERRING DIAG: Chronic Right shoulder pain  THERAPY DIAG:  Chronic right shoulder pain  Muscle weakness (generalized)  Stiffness of right shoulder, not elsewhere classified  Abnormal posture  Rationale for Evaluation and Treatment: Rehabilitation  ONSET DATE: June 2025 exacerbated again in October  SUBJECTIVE:                                                                                                                                                                                      SUBJECTIVE STATEMENT:  Did well after last visit. No pain over the weekend and I did a lot of cleaning. I am feeling good.   EVAL My shoulder started hurting worse the first weekend in October helping my husband in the yard spreading mulch and lifting and handing landscaping bricks . Every little thing I did bothered me. I couldn't sleep and  it hurt so bad.  I had another injection on 09/23/2024 and it has been better since then. Still hard to reach with my right hand. I am sleeping OK since the injection. I can't lift heavy things. Still difficult to sweep/ vacuum. I have not returned to my normal activities yet. Pain radiates down the front of my right arm, past the elbow. Sometimes there is tingling in my hand.   Hand dominance: Right  PERTINENT HISTORY: Pt had been working out with a trainer for exercise but did not notice any pain at the time. Every once in  a while she would have some shoulder pain. The last time she saw the trainer and did some overhead activities and pain started after that. Pain started in approximately June of this year. She describes pain at the top of the shoulder that radiates into the right arm mostly to the elbow. She was given a Subacromial injection on 06/22/2024 . After a few days the injection kicked in and pain has improved some but is still moderate. It hurts when she bends her elbow. She was discharged from formal PT on 08/23/2024 with good relied of pain. She had a second injection on 09/23/2024 with good relief  PAIN:  Are you having pain? Yes: NPRS scale: 0/10 at rest,  Pain location: right shoulder Pain description: anterior shoulder to elbow and into forearm, sharp before injection Aggravating factors: lifting percolater, heavier things, Relieving factors: injection, Tylenol , motrin  PM  PRECAUTIONS: pre DM , prior thrombophlebitis LE, IBS,   RED FLAGS: None   WEIGHT BEARING RESTRICTIONS: No  FALLS:  Has patient fallen in last 6 months? 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   PLOF: Independent  PATIENT GOALS: Decrease pain  NEXT MD VISIT:   OBJECTIVE:  Note: Objective measures were completed at Evaluation unless otherwise  noted.  DIAGNOSTIC FINDINGS:  No new Xrays: good joint space  PATIENT SURVEYS :  Quick Dash:  QUICK DASH  Please rate your ability do the following activities in the last week by selecting the number below the appropriate response.   Activities Rating  Open a tight or new jar.  2 = Mild difficulty  Do heavy household chores (e.g., wash walls, floors). 2 = Mild difficulty  Carry a shopping bag or briefcase 3 = Moderate difficulty  Wash your back. 2 = Mild difficulty  Use a knife to cut food. 1 = No difficulty   Recreational activities in which you take some force or impact through your arm, shoulder or hand (e.g., golf, hammering, tennis, etc.). 2 = Mild difficulty  During the past week, to what extent has your arm, shoulder or hand problem interfered with your normal social activities with family, friends, neighbors or groups?  2 = Slightly  During the past week, were you limited in your work or other regular daily activities as a result of your arm, shoulder or hand problem? 3 = Moderately limited  Rate the severity of the following symptoms in the last week: Arm, Shoulder, or hand pain. 2 = Mild  Rate the severity of the following symptoms in the last week: Tingling (pins and needles) in your arm, shoulder or hand. 2 = Mild  During the past week, how much difficulty have you had sleeping because of the pain in your arm, shoulder or hand?  2 = Mild difficulty   (A QuickDASH score may not be calculated if there is greater than 1 missing item.)  Quick Dash Disability/Symptom Score: = 27.27%  Minimally Clinically Important Difference (MCID): 15-20 points  Flavio, F. et al. (2013). Minimally clinically important difference of the disabilities of the arm, shoulder, and hand outcome measures (DASH) and its shortened version (Quick DASH). Journal of Orthopaedic & Sports Physical Therapy, 44(1), 30-39)   COGNITION: Overall cognitive status: Within functional limits for tasks  assessed     SENSATION: WFL  POSTURE: Forward head rounded shoulders, increased thoracic kyphosis  Cervical Screen: negative  UPPER EXTREMITY ROM:   Active ROM Right eval Left eval  Shoulder flexion 129 120  Shoulder extension 54 60  Shoulder abduction 123 compensates 125 compensates  Shoulder adduction    Shoulder internal rotation 53 + 70  Shoulder external rotation 70 83  Elbow flexion    Elbow extension    Wrist flexion    Wrist extension    Wrist ulnar deviation    Wrist radial deviation    Wrist pronation    Wrist supination    (Blank rows = not tested)  UPPER EXTREMITY MMT:  MMT Right eval Left eval  Shoulder flexion 4   Shoulder extension    Shoulder abduction 4-   Shoulder adduction    Shoulder internal rotation 4+   Shoulder external rotation 4   Middle trapezius    Lower trapezius    Elbow flexion 5   Elbow extension 5   Wrist flexion    Wrist extension    Wrist ulnar deviation    Wrist radial deviation    Wrist pronation    Wrist supination    Grip strength (lbs)    (Blank rows = not tested)  SHOULDER SPECIAL TESTS: Impingement tests: Hawkins/Kennedy impingement test: positive  Rotator cuff assessment: Empty can test: positive         PALPATION:  Right supraspinatus mildly. No tenderness to prox biceps mid delt or any other areas                                                                                                                             TREATMENT DATE:   11/01/2024 UBE x 3 min, lev 1, 1:30 ea direction Wall pushups with a plus x 15 Shoulder ranger shoulder flexion and scaption x 12 Free motion LPD bilaterally 3# x 10 Free motion; scapular retraction 3# 2 x 10, 7# 1 x 5  4 D red ball stabs x 15 yellow lateral band walks 2 lengths ea direction x 5 steps ea Bilateral elbow flexion 4# 2 x 10 Reach to eye level shelf x 12 with 1 # wt Supine wand flexion and scaption x 5, 5 sec hold Supine diagonals B thumb up x  10 ea yellow  10/25/2024 UBE x 3 min, lev 1, 1:30 ea direction Wall pushups with a plus x 15 Shoulder ranger shoulder flexion and scaption x 10 4 D red ball stabs x 15 Reach to eye level shelf x 10 with 1 # wt Bilateral elbow flexion with red band x 15 Elbow extension red x 10 Serratus punch 4 # 2 x 10 Supine alphabet x 1, 3 lbs Sword with yellow band x 10 B Supine wand flexion and scaption x 5 ea  10/21/2024  Standing bilateral elbow flexion red x 15 Elbow ext red x 15 Scapular retraction red x 15, Ext red x 15 Bilateral ER x 10 red Wall pushups with a plus x 15 4 D red  ball stabs x 15 Shoulder ranger shoulder flexion and scaption x 10 Serratus punch 4 # x 15  Supine alphabet x 1, 3 lbs Supine horizontal abd x 10 , held due to hand cramp Massaged right hand to get cramp to release Supine wand flexion and scaption x 5 Updated HEP with biceps curls and elbow extension with band Answered pt question about dead lifts and if OK to do with her shoulder; explained bigger concern of watching form with back 10/18/2024 Thoracic extension over towel roll x 10, Standing bilateral scap. Retraction, shoulder ext and bilateral ER with red x 10 UE ranger x15 flexion and scaption Red weighted ball 4 D ball stabs x 15 ea Red elbow flexion B x 15 Right elbow ext red band x 15 Bilateral shoulder horizontal abd red x 15 Supine sword thumb up yellow x 10 ea Serratus punch 3# x 15, Then B x 10 Attempted PROM but pt could not relax well so did not force Supine wand scaption x 5 10/11/2024 Standing bilateral scap. Retraction, shoulder ext and bilateral ER with red x 10 UE ranger x10 flexion and scaption Purple 4 D ball stabs x 10 ea Supine wand flexion and scaption x 3 ea  Supine horizontal abd yellow x 10 Supine alphabet 1# x 1 Serratus punch 2 # x 15 Rhythmic stabs at 90 degrees 2 x 20 sec Alternating isometrics IR/ER 2 x 20 sec PROM right shoulder with multiple VC's for pt to  relax   10/04/2024 Reviewed impingement and reasons for impingement. Scapular compensation still very present Bilaterally with overhead movements. Discussed importance of using ice(gel pack in toweling) when things first get exacerbated to decrease inflammation. Reviewed proper posture with exercises and pt performed scapular retraction, shoulder extension and bilateral ER all with yellow band x 10 using good breathing technique. Pt to start with yellow and may do 10 + 5 reps with yellow with good posture and never working into pain   PATIENT EDUCATION: Access Code: [email or web address] URL: [email or web address] Date: 10/21/2024 Prepared by: Grayce Sheldon  Exercises - Standing Bicep Curls with Resistance  - 1 x daily - 7 x weekly - 10-15 reps - Standing Elbow Extension with Self-Anchored Resistance  - 1 x daily - 7 x weekly - 1 sets - 10-15 reps Education details: Postural band exs, reasons for impingement, avoid working arms with trainer temporarily Person educated: Patient Education method: Medical Illustrator Education comprehension: verbalized understanding and returned demonstration  HOME EXERCISE PROGRAM: Postural band;Scapular retraction, shoulder extension, bilateral ER with yellow, Biceps curls triceps with red  ASSESSMENT:  CLINICAL IMPRESSION:  Pt did very well with progression of exercises today. She had no pain with activities today. Should be ready to DC next week. Pending DC  EVAL Patient is a 68 y.o. female who was seen today for physical therapy evaluation and treatment for complaints of right shoulder pain that started in June. She had been working out with a trainer, but did not notice any pain at the time. At her last visit she did overhead triceps ext with her trainer and developed right shoulder pain thereafter. She is s/p a Subacromial Injection on 06/22/2024 and is feeling some better since then.  She had 8 PT visits and was discharged on 08/23/2024  after meeting all of her goals.  She experienced increased shoulder pain in late  October after working in the yard with her husband spreading mulch and lifting landscape bricks and passing to her husband.She saw the Dr. Moise  on 09/23/2024 and was given another injection and referred for PT.She presents with right shoulder pain, and with limitations in AROM, and noted UT compensation with overhead activities. She has a mild + impingement test, and empty can test. She has poor round shouldered, forward head posture . Quick dash 27.27 %. She will benefit from  skilled physical therapy to address deficits and to return to a more functional lifestyle.  OBJECTIVE IMPAIRMENTS: . decreased activity tolerance, decreased knowledge of condition, decreased ROM, decreased strength, impaired UE functional use, postural dysfunction, and pain.   ACTIVITY LIMITATIONS: lifting, sleeping, reach over head, and hygiene/grooming   PARTICIPATION LIMITATIONS: cleaning and activities requiring reaching   PERSONAL FACTORS: scapular compensation, thoracic kyphosis are also affecting patient's functional outcome.   REHAB POTENTIAL: Good  CLINICAL DECISION MAKING: Evolving/moderate complexity  EVALUATION COMPLEXITY: Moderate  GOALS: Goals reviewed with patient? Yes  SHORT TERM GOALS: Target date: 10/25/2024  Pt will be independent with a HEP  Baseline: Goal status: MET 2.  Pt will have decreased Right shoulder pain by 25%  Baseline:  Goal status: MET 11/01/2024  3.  Pt will be able to carry a shopping bag with minimal difficulty Baseline: Moderate Goal status:MET 11/01/2024  LONG TERM GOALS: Target date: 11/15/2024  Pt will report shoulder pain improved by 50% or greater  Baseline:  Goal status: INITIAL  2. Pt will have no difficulty sleeping  Baseline :mild  INITIAL: MET 11/01/2024 3.  Quick dash will improve to no greater than 12%  Baseline:  Goal status: INITIAL  4.  Right shoulder ROM will be  improved to within 5 degrees of left shoulder of flexion and abd for improved reaching  Baseline:  Goal status: INITIAL  5.  Pt will be able to sweep and do light vacuuming without increased shoulder pain Baseline:  Goal status: INITIAL  PLAN: PT FREQUENCY: 1-2x/week, starting 2x/ week for 3 weeks then decreasing to 1x/week as able  PT DURATION: 6 weeks  PLANNED INTERVENTIONS: 97164- PT Re-evaluation, 97110-Therapeutic exercises, 97530- Therapeutic activity, 97112- Neuromuscular re-education, 97535- Self Care, [id number]- Manual therapy, 97033- Ionotophoresis 4mg /ml Dexamethasone, and Joint mobilization   PLAN FOR NEXT SESSION:goals, consider DC next,UBE Joint mobs, supine AAROM, PROM, review theraband and progress, progress strength and decrease UT compensation   Grayce JINNY Sheldon, PT 11/01/2024, 11:52 AM

## 2024-11-08 ENCOUNTER — Ambulatory Visit: Admitting: Physical Therapy

## 2024-11-15 ENCOUNTER — Ambulatory Visit

## 2024-11-15 DIAGNOSIS — R293 Abnormal posture: Secondary | ICD-10-CM

## 2024-11-15 DIAGNOSIS — G8929 Other chronic pain: Secondary | ICD-10-CM

## 2024-11-15 DIAGNOSIS — M6281 Muscle weakness (generalized): Secondary | ICD-10-CM

## 2024-11-15 DIAGNOSIS — M25611 Stiffness of right shoulder, not elsewhere classified: Secondary | ICD-10-CM

## 2024-11-15 DIAGNOSIS — M25511 Pain in right shoulder: Secondary | ICD-10-CM | POA: Diagnosis not present

## 2024-11-15 NOTE — Therapy (Signed)
 OUTPATIENT PHYSICAL THERAPY UPPER EXTREMITY TREATMENT   Patient Name: [name] MRN: [id number] DOB:10/24/1956, 68 y.o., female Today's Date: 11/15/2024  END OF SESSION:  PT End of Session - 11/15/24 1105     Visit Number 7    Number of Visits 12    Date for Recertification  11/15/24    Authorization Type 1 re-eval and 12 visits    Authorization Time Period 10/04/2024-11/15/2024    Authorization - Visit Number 7    Authorization - Number of Visits 12    PT Start Time 1106    PT Stop Time 1156    PT Time Calculation (min) 50 min    Activity Tolerance Patient tolerated treatment well    Behavior During Therapy [facility] for tasks assessed/performed          Past Medical History:  Diagnosis Date   Varicose veins of both lower extremities    Past Surgical History:  Procedure Laterality Date   varicose veins Bilateral    Patient Active Problem List   Diagnosis Date Noted   Fecal urgency 08/22/2017   Abdominal bloating 08/21/2017   Generalized abdominal pain 08/21/2017   Irritable bowel syndrome with diarrhea 08/21/2017   Distal radius fracture 07/29/2012    PCP:   REFERRING PROVIDER: Leonce Reveal, PA  REFERRING DIAG: Chronic Right shoulder pain  THERAPY DIAG:  Chronic right shoulder pain  Muscle weakness (generalized)  Stiffness of right shoulder, not elsewhere classified  Abnormal posture  Rationale for Evaluation and Treatment: Rehabilitation  ONSET DATE: June 2025 exacerbated again in October  SUBJECTIVE:                                                                                                                                                                                      SUBJECTIVE STATEMENT:  Did well after last visit. My shoulder is doing very well, and I haven't had any pain since she was here last. I have been able to sweep/vacuum without problems, and I can pick up my percolator without pain. My husband and I are going to start at one  of the gyms and have a trainer too.   EVAL My shoulder started hurting worse the first weekend in October helping my husband in the yard spreading mulch and lifting and handing landscaping bricks . Every little thing I did bothered me. I couldn't sleep and it hurt so bad.  I had another injection on 09/23/2024 and it has been better since then. Still hard to reach with my right hand. I am sleeping OK since the injection. I can't lift heavy things. Still difficult to sweep/ vacuum. I have not returned to  my normal activities yet. Pain radiates down the front of my right arm, past the elbow. Sometimes there is tingling in my hand.   Hand dominance: Right  PERTINENT HISTORY: Pt had been working out with a trainer for exercise but did not notice any pain at the time. Every once in a while she would have some shoulder pain. The last time she saw the trainer and did some overhead activities and pain started after that. Pain started in approximately June of this year. She describes pain at the top of the shoulder that radiates into the right arm mostly to the elbow. She was given a Subacromial injection on 06/22/2024 . After a few days the injection kicked in and pain has improved some but is still moderate. It hurts when she bends her elbow. She was discharged from formal PT on 08/23/2024 with good relied of pain. She had a second injection on 09/23/2024 with good relief  PAIN:  Are you having pain? NO  PRECAUTIONS: pre DM , prior thrombophlebitis LE, IBS,   RED FLAGS: None   WEIGHT BEARING RESTRICTIONS: No  FALLS:  Has patient fallen in last 6 months? 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   PLOF: Independent  PATIENT GOALS: Decrease pain  NEXT MD VISIT:   OBJECTIVE:  Note: Objective measures were completed at Evaluation unless otherwise  noted.  DIAGNOSTIC FINDINGS:  No new Xrays: good joint space  PATIENT SURVEYS :  Quick Dash:  QUICK DASH  Please rate your ability do the following activities in the last week by selecting the number below the appropriate response.   Activities Rating  Open a tight or new jar.  2 = Mild difficulty  Do heavy household chores (e.g., wash walls, floors). 2 = Mild difficulty  Carry a shopping bag or briefcase 3 = Moderate difficulty  Wash your back. 2 = Mild difficulty  Use a knife to cut food. 1 = No difficulty   Recreational activities in which you take some force or impact through your arm, shoulder or hand (e.g., golf, hammering, tennis, etc.). 2 = Mild difficulty  During the past week, to what extent has your arm, shoulder or hand problem interfered with your normal social activities with family, friends, neighbors or groups?  2 = Slightly  During the past week, were you limited in your work or other regular daily activities as a result of your arm, shoulder or hand problem? 3 = Moderately limited  Rate the severity of the following symptoms in the last week: Arm, Shoulder, or hand pain. 2 = Mild  Rate the severity of the following symptoms in the last week: Tingling (pins and needles) in your arm, shoulder or hand. 2 = Mild  During the past week, how much difficulty have you had sleeping because of the pain in your arm, shoulder or hand?  2 = Mild difficulty   (A QuickDASH score may not be calculated if there is greater than 1 missing item.)  Quick Dash Disability/Symptom Score: = 27.27% EVAL Quick Dash; 11/15/2024:6.82  Minimally Clinically Important Difference (MCID): 15-20 points  (Franchignoni, F. et al. (2013). Minimally clinically important difference of the disabilities of the arm, shoulder, and hand outcome measures (DASH) and its shortened version (Quick DASH). Journal of Orthopaedic & Sports Physical Therapy, 44(1), 30-39)   COGNITION: Overall cognitive status: Within  functional limits for tasks assessed     SENSATION: WFL  POSTURE: Forward head rounded shoulders, increased thoracic kyphosis     Cervical Screen: negative  UPPER EXTREMITY ROM:   Active ROM Right eval Left eval RIGHT 11/15/2024  Shoulder flexion 129 120 132  Shoulder extension 54 60   Shoulder abduction 123 compensates 125 compensates 145  Shoulder adduction     Shoulder internal rotation 53 + 70   Shoulder external rotation 70 83 92  Elbow flexion     Elbow extension     Wrist flexion     Wrist extension     Wrist ulnar deviation     Wrist radial deviation     Wrist pronation     Wrist supination     (Blank rows = not tested)  UPPER EXTREMITY MMT:  MMT Right eval Left eval  Shoulder flexion 4   Shoulder extension    Shoulder abduction 4-   Shoulder adduction    Shoulder internal rotation 4+   Shoulder external rotation 4   Middle trapezius    Lower trapezius    Elbow flexion 5   Elbow extension 5   Wrist flexion    Wrist extension    Wrist ulnar deviation    Wrist radial deviation    Wrist pronation    Wrist supination    Grip strength (lbs)    (Blank rows = not tested)  SHOULDER SPECIAL TESTS: Impingement tests: Hawkins/Kennedy impingement test: positive  Rotator cuff assessment: Empty can test: positive         PALPATION:  Right supraspinatus mildly. No tenderness to prox biceps mid delt or any other areas                                                                                                                             TREATMENT DATE:    12/152025 Discussed progress and reviewed goals Reviewed band for HEP; Scapular retraction red band 2x 10, shoulder extension red band 2 x 10, bilateral ER 2 x 10 Elbow Flexion red band x 10, Triceps extension red band 1 x 10 Shoulder ranger x 10 flexion and  scaption Jobes flexion and scaption x 10 ea to shoulder height Wall push ups with a plus x 10, 4 D ball rolls x 15 ea Reach to shelf  1# x 10  11/01/2024 UBE x 3 min, lev 1, 1:30 ea direction Wall pushups with a plus x 15 Shoulder ranger shoulder flexion and scaption x 12 Free motion LPD bilaterally 3# x 10 Free motion; scapular retraction 3# 2 x 10, 7# 1 x 5  4 D red ball stabs x 15 yellow lateral band walks 2 lengths ea direction x 5 steps ea Bilateral elbow flexion 4# 2 x 10 Reach to eye level shelf x 12 with 1 # wt Supine wand flexion and scaption x 5, 5 sec hold Supine diagonals B thumb up x 10 ea yellow  10/25/2024  UBE x 3 min, lev 1, 1:30 ea direction Wall pushups with a plus x 15 Shoulder ranger shoulder flexion and scaption x 10 4 D red ball stabs x 15 Reach to eye level shelf x 10 with 1 # wt Bilateral elbow flexion with red band x 15 Elbow extension red x 10 Serratus punch 4 # 2 x 10 Supine alphabet x 1, 3 lbs Sword with yellow band x 10 B Supine wand flexion and scaption x 5 ea  10/21/2024  Standing bilateral elbow flexion red x 15 Elbow ext red x 15 Scapular retraction red x 15, Ext red x 15 Bilateral ER x 10 red Wall pushups with a plus x 15 4 D red ball stabs x 15 Shoulder ranger shoulder flexion and scaption x 10 Serratus punch 4 # x 15  Supine alphabet x 1, 3 lbs Supine horizontal abd x 10 , held due to hand cramp Massaged right hand to get cramp to release Supine wand flexion and scaption x 5 Updated HEP with biceps curls and elbow extension with band Answered pt question about dead lifts and if OK to do with her shoulder; explained bigger concern of watching form with back 10/18/2024 Thoracic extension over towel roll x 10, Standing bilateral scap. Retraction, shoulder ext and bilateral ER with red x 10 UE ranger x15 flexion and scaption Red weighted ball 4 D ball stabs x 15 ea Red elbow flexion B x 15 Right elbow ext red band x 15 Bilateral shoulder horizontal abd red x 15 Supine sword thumb up yellow x 10 ea Serratus punch 3# x 15, Then B x 10 Attempted PROM but pt could not  relax well so did not force Supine wand scaption x 5 10/11/2024 Standing bilateral scap. Retraction, shoulder ext and bilateral ER with red x 10 UE ranger x10 flexion and scaption Purple 4 D ball stabs x 10 ea Supine wand flexion and scaption x 3 ea  Supine horizontal abd yellow x 10 Supine alphabet 1# x 1 Serratus punch 2 # x 15 Rhythmic stabs at 90 degrees 2 x 20 sec Alternating isometrics IR/ER 2 x 20 sec PROM right shoulder with multiple VC's for pt to relax   10/04/2024 Reviewed impingement and reasons for impingement. Scapular compensation still very present Bilaterally with overhead movements. Discussed importance of using ice(gel pack in toweling) when things first get exacerbated to decrease inflammation. Reviewed proper posture with exercises and pt performed scapular retraction, shoulder extension and bilateral ER all with yellow band x 10 using good breathing technique. Pt to start with yellow and may do 10 + 5 reps with yellow with good posture and never working into pain   PATIENT EDUCATION: Access Code: [email or web address] URL: [email or web address] Date: 10/21/2024 Prepared by: Grayce Sheldon  Exercises - Standing Bicep Curls with Resistance  - 1 x daily - 7 x weekly - 10-15 reps - Standing Elbow Extension with Self-Anchored Resistance  - 1 x daily - 7 x weekly - 1 sets - 10-15 reps Education details: Postural band exs, reasons for impingement, avoid working arms with trainer temporarily Person educated: Patient Education method: Medical Illustrator Education comprehension: verbalized understanding and returned demonstration  HOME EXERCISE PROGRAM: Postural band;Scapular retraction, shoulder extension, bilateral ER with yellow, Biceps curls triceps with red  ASSESSMENT:  CLINICAL IMPRESSION:  Pt has achieved all goals established. She has had no right shoulder pain recently, and she has resumed her normal vacuuming/household chores without pain. Her  ROM has improved and she is  having no difficulty sleeping. Quick dash has improved from 27.27 to 6.82, She feels ready to be released to independence with HEP.  OBJECTIVE IMPAIRMENTS: . decreased activity tolerance, decreased knowledge of condition, decreased ROM, decreased strength, impaired UE functional use, postural dysfunction, and pain.   ACTIVITY LIMITATIONS: lifting, sleeping, reach over head, and hygiene/grooming   PARTICIPATION LIMITATIONS: cleaning and activities requiring reaching   PERSONAL FACTORS: scapular compensation, thoracic kyphosis are also affecting patient's functional outcome.   REHAB POTENTIAL: Good  CLINICAL DECISION MAKING: Evolving/moderate complexity  EVALUATION COMPLEXITY: Moderate  GOALS: Goals reviewed with patient? Yes  SHORT TERM GOALS: Target date: 10/25/2024  Pt will be independent with a HEP  Baseline: Goal status: MET 2.  Pt will have decreased Right shoulder pain by 25%  Baseline:  Goal status: MET 11/01/2024  3.  Pt will be able to carry a shopping bag with minimal difficulty Baseline: Moderate Goal status:MET 11/01/2024  LONG TERM GOALS: Target date: 11/15/2024  Pt will report shoulder pain improved by 50% or greater  Baseline:  Goal status: MET 11/15/2024 2. Pt will have no difficulty sleeping  Baseline :mild  INITIAL: MET 11/01/2024 3.  Quick dash will improve to no greater than 12%  Baseline:  Goal status: MET 11/15/2024 4.  Right shoulder ROM will be improved to within 5 degrees of left shoulder of flexion and abd for improved reaching  Baseline:  Goal status: MET 11/15/2024 5.  Pt will be able to sweep and do light vacuuming without increased shoulder pain Baseline:  Goal status: MET 11/15/2024  PLAN: PT FREQUENCY: 1-2x/week, starting 2x/ week for 3 weeks then decreasing to 1x/week as able  PT DURATION: 6 weeks  PLANNED INTERVENTIONS: 97164- PT Re-evaluation, 97110-Therapeutic exercises, 97530- Therapeutic activity,  97112- Neuromuscular re-education, 97535- Self Care, 02859- Manual therapy, 97033- Ionotophoresis 4mg /ml Dexamethasone, and Joint mobilization   PLAN FOR NEXT SESSION:DC to HEP PHYSICAL THERAPY DISCHARGE SUMMARY  Visits from Start of Care: 7  Current functional level related to goals / functional outcomes: Achieved all goals   Remaining deficits: Intermittent [place] compensation   Education / Equipment: HEP   Patient agrees to discharge. Patient goals were met. Patient is being discharged due to meeting the stated rehab goals.   Grayce JINNY Sheldon, PT 11/15/2024, 11:58 AM

## 2024-11-15 NOTE — Patient Instructions (Signed)
 SHOULDER: Flexion - Supine (Cane)        Cancer Rehab [phone]    Hold cane in both hands. Raise arms up overhead. Do not allow back to arch. Hold _5__ seconds. Do __5__ times; __1-2__ times a day.  Hands shoulder width V position    Wall push ups; 10-15 reps   Ball rolls on wall 10 -15 reps , up/down, side to side, circles  Copyright  VHI. All rights reserved.
